# Patient Record
Sex: Female | Born: 1940 | Race: White | Hispanic: No | Marital: Married | Smoking: Never smoker
Health system: Southern US, Community
[De-identification: ages and names within clinical notes are randomized; demographics above are authoritative.]

## PROBLEM LIST (undated history)

## (undated) DIAGNOSIS — E785 Hyperlipidemia, unspecified: Secondary | ICD-10-CM

## (undated) DIAGNOSIS — I447 Left bundle-branch block, unspecified: Secondary | ICD-10-CM

## (undated) DIAGNOSIS — G4730 Sleep apnea, unspecified: Secondary | ICD-10-CM

## (undated) DIAGNOSIS — M1990 Unspecified osteoarthritis, unspecified site: Secondary | ICD-10-CM

## (undated) DIAGNOSIS — D329 Benign neoplasm of meninges, unspecified: Secondary | ICD-10-CM

## (undated) DIAGNOSIS — J40 Bronchitis, not specified as acute or chronic: Secondary | ICD-10-CM

## (undated) DIAGNOSIS — Z973 Presence of spectacles and contact lenses: Secondary | ICD-10-CM

## (undated) DIAGNOSIS — I998 Other disorder of circulatory system: Secondary | ICD-10-CM

## (undated) DIAGNOSIS — R51 Headache: Secondary | ICD-10-CM

## (undated) DIAGNOSIS — J069 Acute upper respiratory infection, unspecified: Secondary | ICD-10-CM

## (undated) DIAGNOSIS — C801 Malignant (primary) neoplasm, unspecified: Secondary | ICD-10-CM

## (undated) DIAGNOSIS — M8580 Other specified disorders of bone density and structure, unspecified site: Secondary | ICD-10-CM

## (undated) DIAGNOSIS — G4700 Insomnia, unspecified: Secondary | ICD-10-CM

## (undated) DIAGNOSIS — R519 Headache, unspecified: Secondary | ICD-10-CM

## (undated) HISTORY — PX: OTHER SURGICAL HISTORY: SHX169

## (undated) HISTORY — DX: Unspecified osteoarthritis, unspecified site: M19.90

## (undated) HISTORY — PX: CATARACT EXTRACTION W/ INTRAOCULAR LENS IMPLANT: SHX1309

## (undated) HISTORY — DX: Benign neoplasm of meninges, unspecified: D32.9

## (undated) HISTORY — PX: SMALL INTESTINE SURGERY: SHX150

## (undated) HISTORY — PX: APPENDECTOMY: SHX54

## (undated) HISTORY — DX: Other specified disorders of bone density and structure, unspecified site: M85.80

## (undated) HISTORY — DX: Insomnia, unspecified: G47.00

## (undated) HISTORY — PX: HERNIA REPAIR: SHX51

## (undated) HISTORY — DX: Acute upper respiratory infection, unspecified: J06.9

## (undated) HISTORY — DX: Hyperlipidemia, unspecified: E78.5

## (undated) HISTORY — PX: KNEE ARTHROSCOPY W/ MEDIAL COLLATERAL LIGAMENT (MCL) REPAIR: SHX1876

## (undated) HISTORY — PX: DILATION AND CURETTAGE OF UTERUS: SHX78

## (undated) HISTORY — PX: WISDOM TOOTH EXTRACTION: SHX21

---

## 1999-07-05 ENCOUNTER — Other Ambulatory Visit: Admission: RE | Admit: 1999-07-05 | Discharge: 1999-07-05 | Payer: Self-pay | Admitting: Internal Medicine

## 2002-11-28 ENCOUNTER — Inpatient Hospital Stay (HOSPITAL_COMMUNITY): Admission: EM | Admit: 2002-11-28 | Discharge: 2002-12-08 | Payer: Self-pay | Admitting: Emergency Medicine

## 2002-11-28 ENCOUNTER — Encounter: Payer: Self-pay | Admitting: General Surgery

## 2002-11-28 ENCOUNTER — Encounter (INDEPENDENT_AMBULATORY_CARE_PROVIDER_SITE_OTHER): Payer: Self-pay | Admitting: Specialist

## 2002-12-06 ENCOUNTER — Encounter: Payer: Self-pay | Admitting: General Surgery

## 2004-03-01 ENCOUNTER — Observation Stay (HOSPITAL_COMMUNITY): Admission: RE | Admit: 2004-03-01 | Discharge: 2004-03-02 | Payer: Self-pay | Admitting: General Surgery

## 2004-10-22 ENCOUNTER — Ambulatory Visit: Payer: Self-pay | Admitting: Sports Medicine

## 2005-09-16 ENCOUNTER — Ambulatory Visit: Payer: Self-pay | Admitting: Sports Medicine

## 2007-10-25 ENCOUNTER — Encounter: Payer: Self-pay | Admitting: Internal Medicine

## 2007-11-25 ENCOUNTER — Encounter: Payer: Self-pay | Admitting: Sports Medicine

## 2009-07-10 ENCOUNTER — Encounter (INDEPENDENT_AMBULATORY_CARE_PROVIDER_SITE_OTHER): Payer: Self-pay | Admitting: *Deleted

## 2009-08-07 ENCOUNTER — Ambulatory Visit: Payer: Self-pay | Admitting: Sports Medicine

## 2009-08-07 DIAGNOSIS — M25469 Effusion, unspecified knee: Secondary | ICD-10-CM

## 2009-08-07 DIAGNOSIS — M25569 Pain in unspecified knee: Secondary | ICD-10-CM | POA: Insufficient documentation

## 2009-08-21 ENCOUNTER — Encounter (INDEPENDENT_AMBULATORY_CARE_PROVIDER_SITE_OTHER): Payer: Self-pay | Admitting: *Deleted

## 2009-08-23 ENCOUNTER — Ambulatory Visit: Payer: Self-pay | Admitting: Internal Medicine

## 2009-08-27 ENCOUNTER — Telehealth (INDEPENDENT_AMBULATORY_CARE_PROVIDER_SITE_OTHER): Payer: Self-pay | Admitting: *Deleted

## 2009-08-30 ENCOUNTER — Ambulatory Visit: Payer: Self-pay | Admitting: Internal Medicine

## 2010-09-19 NOTE — Procedures (Signed)
Summary: Colonoscopy  Patient: [name] Note: All result statuses are Final unless otherwise noted.  Tests: (1) Colonoscopy (COL)   COL Colonoscopy           DONE     [facility]     [address]     [place], [place]  [address]           COLONOSCOPY PROCEDURE REPORT           PATIENT:  Susan Bruce, Susan Bruce  MR#:  [id number]     BIRTHDATE:  08-05-1941, 68 yrs. old  GENDER:  female           ENDOSCOPIST:  Wilhemina Bonito. Eda Keys, MD     Referred by:  Geoffry Paradise, M.D.           PROCEDURE DATE:  08/30/2009     PROCEDURE:  Average-risk screening colonoscopy     G0121     ASA CLASS:  Class I     INDICATIONS:  Routine Risk Screening           MEDICATIONS:   Fentanyl 75 mcg IV, Versed 8 mg IV           DESCRIPTION OF PROCEDURE:   After the risks benefits and     alternatives of the procedure were thoroughly explained, informed     consent was obtained.  Digital rectal exam was performed and     revealed no abnormalities.   The LB CF-H180AL [id number] endoscope     was introduced through the anus and advanced to the anastamosis,     without limitations. Time to anastomosis = 3:79min. The quality of     the prep was good, using MoviPrep.  The instrument was then     withdrawn ( time = 16:17 min) as the colon was fully examined.     <<PROCEDUREIMAGES>>           FINDINGS:  There was a normal ileo-colonic anastamosis.  A normal     appearing cecum, ileocecal valve, and appendiceal orifice were     identified. The ascending, hepatic flexure, transverse, splenic     flexure, descending, sigmoid colon, and rectum appeared     unremarkable.  No polyps or cancers were seen.   Retroflexed views     in the rectum revealed internal hemorrhoids.    The scope was then     withdrawn from the patient and the procedure completed.           COMPLICATIONS:  None           ENDOSCOPIC IMPRESSION:     1) Normal anastamosis, ileo-col     2) Normal colon     3) No polyps or cancers     4) Internal  hemorrhoids     RECOMMENDATIONS:     1) Continue current colorectal screening recommendations for     "routine risk" patients with a repeat colonoscopy in 10 years.           ______________________________     Wilhemina Bonito. Eda Keys, MD           CC:  Geoffry Paradise, MD; The Patient           n.     eSIGNED:   Wilhemina Bonito. Eda Keys at 08/30/2009 09:02 AM           Berdine Addison, [id number]  Note: An exclamation mark (!) indicates a result that was not dispersed into the flowsheet.  Document Creation Date: 08/30/2009 9:03 AM _______________________________________________________________________  (1) Order result status: Final Collection or observation date-time: 08/30/2009 08:49 Requested date-time:  Receipt date-time:  Reported date-time:  Referring Physician:   Ordering Physician: Fransico Setters [phone]) Specimen Source:  Source: Launa Grill Order Number: [id number] Lab site:   Appended Document: Colonoscopy    Clinical Lists Changes  Observations: Added new observation of COLONNXTDUE: 08/2019 (08/30/2009 11:18)

## 2010-09-19 NOTE — Letter (Signed)
Summary: [facility] Instructions  [facility]  [address] [place], [place] [address]   Phone: [phone]  Fax: [phone]       Susan Bruce    April 10, 1941    MRN: [id number]        Procedure Day Dorna Bloom: Magdalene Molly. 01/13     Arrival Time:  7:30am     Procedure Time:  8:00am     Location of Procedure:                    _X _  [facility] (4th Floor)                        PREPARATION FOR COLONOSCOPY WITH MOVIPREP   Starting 5 days prior to your procedure 01/08  do not eat nuts, seeds, popcorn, corn, beans, peas,  salads, or any raw vegetables.  Do not take any fiber supplements (e.g. Metamucil, Citrucel, and Benefiber).  THE DAY BEFORE YOUR PROCEDURE         DATE:  01/12  DAY: Wed.  1.  Drink clear liquids the entire day-NO SOLID FOOD  2.  Do not drink anything colored red or purple.  Avoid juices with pulp.  No orange juice.  3.  Drink at least 64 oz. (8 glasses) of fluid/clear liquids during the day to prevent dehydration and help the prep work efficiently.  CLEAR LIQUIDS INCLUDE: Water Jello Ice Popsicles Tea (sugar ok, no milk/cream) Powdered fruit flavored drinks Coffee (sugar ok, no milk/cream) Gatorade Juice: apple, white grape, white cranberry  Lemonade Clear bullion, consomm, broth Carbonated beverages (any kind) Strained chicken noodle soup Hard Candy                             4.  In the morning, mix first dose of MoviPrep solution:    Empty 1 Pouch A and 1 Pouch B into the disposable container    Add lukewarm drinking water to the top line of the container. Mix to dissolve    Refrigerate (mixed solution should be used within 24 hrs)  5.  Begin drinking the prep at 5:00 p.m. The MoviPrep container is divided by 4 marks.   Every 15 minutes drink the solution down to the next mark (approximately 8 oz) until the full liter is complete.   6.  Follow completed prep with 16 oz of clear liquid of your choice (Nothing red or  purple).  Continue to drink clear liquids until bedtime.  7.  Before going to bed, mix second dose of MoviPrep solution:    Empty 1 Pouch A and 1 Pouch B into the disposable container    Add lukewarm drinking water to the top line of the container. Mix to dissolve    Refrigerate  THE DAY OF YOUR PROCEDURE      DATE: 01/13  DAY: Magdalene Molly.  Beginning at 3:00 a.m. (5 hours before procedure):         1. Every 15 minutes, drink the solution down to the next mark (approx 8 oz) until the full liter is complete.  2. Follow completed prep with 16 oz. of clear liquid of your choice.    3. You may drink clear liquids until 6:00am (2 HOURS BEFORE PROCEDURE).   MEDICATION INSTRUCTIONS  Unless otherwise instructed, you should take regular prescription medications with a small sip of water   as early as possible the  morning of your procedure.          OTHER INSTRUCTIONS  You will need a responsible adult at least 68 years of age to accompany you and drive you home.   This person must remain in the waiting room during your procedure.  Wear loose fitting clothing that is easily removed.  Leave jewelry and other valuables at home.  However, you may wish to bring a book to read or  an iPod/MP3 player to listen to music as you wait for your procedure to start.  Remove all body piercing jewelry and leave at home.  Total time from sign-in until discharge is approximately 2-3 hours.  You should go home directly after your procedure and rest.  You can resume normal activities the  day after your procedure.  The day of your procedure you should not:   Drive   Make legal decisions   Operate machinery   Drink alcohol   Return to work  You will receive specific instructions about eating, activities and medications before you leave.    The above instructions have been reviewed and explained to me by  Susan Sites RN  August 23, 2009 10:35 AM      I fully understand and can  verbalize these instructions _____________________________ Date _________

## 2010-09-19 NOTE — Miscellaneous (Signed)
Summary: [facility]  Clinical Lists Changes  Medications: Added new medication of MOVIPREP 100 GM  SOLR (PEG-KCL-NACL-NASULF-NA ASC-C) As per prep instructions. - Signed Rx of MOVIPREP 100 GM  SOLR (PEG-KCL-NACL-NASULF-NA ASC-C) As per prep instructions.;  #1 x 0;  Signed;  Entered by: Ezra Sites RN;  Authorized by: Hilarie Fredrickson MD;  Method used: Electronically to [id number] [address] [place], [place]  [address], Ph: [phone] or [phone], Fax: [phone] Allergies: Changed allergy or adverse reaction from PCN to PCN    Prescriptions: MOVIPREP 100 GM  SOLR (PEG-KCL-NACL-NASULF-NA ASC-C) As per prep instructions.  #1 x 0   Entered by:   Ezra Sites RN   Authorized by:   Hilarie Fredrickson MD   Signed by:   Ezra Sites RN on 08/23/2009   Method used:   Electronically to        [facility] (retail)       [address]       [place], [place]  [address]       Ph: [phone] or [phone]       Fax: [phone]   RxID:   [id number]

## 2010-09-19 NOTE — Progress Notes (Signed)
Summary: Questions about Procedure  Phone Note Call from Patient Call back at Home Phone [phone]   Caller: Patient Call For: Dr. Marina Goodell Reason for Call: Talk to Nurse Summary of Call: Pt has some questions about her prep work for her procedure on Thurs. Initial call taken by: Karna Christmas,  August 27, 2009 2:47 PM  Follow-up for Phone Call        pt's questions were answered Follow-up by: Ezra Sites RN,  August 27, 2009 3:19 PM

## 2010-11-04 ENCOUNTER — Ambulatory Visit (HOSPITAL_BASED_OUTPATIENT_CLINIC_OR_DEPARTMENT_OTHER)
Admission: RE | Admit: 2010-11-04 | Discharge: 2010-11-04 | Disposition: A | Discharge status: Home / Self Care | Payer: Medicare Other | Source: Ambulatory Visit | Attending: Orthopedic Surgery | Admitting: Orthopedic Surgery

## 2010-11-04 DIAGNOSIS — M23329 Other meniscus derangements, posterior horn of medial meniscus, unspecified knee: Secondary | ICD-10-CM | POA: Insufficient documentation

## 2010-11-04 DIAGNOSIS — Z01812 Encounter for preprocedural laboratory examination: Secondary | ICD-10-CM | POA: Insufficient documentation

## 2010-11-04 DIAGNOSIS — Z01810 Encounter for preprocedural cardiovascular examination: Secondary | ICD-10-CM | POA: Insufficient documentation

## 2010-11-04 DIAGNOSIS — M23302 Other meniscus derangements, unspecified lateral meniscus, unspecified knee: Secondary | ICD-10-CM | POA: Insufficient documentation

## 2010-11-04 DIAGNOSIS — M1710 Unilateral primary osteoarthritis, unspecified knee: Secondary | ICD-10-CM | POA: Insufficient documentation

## 2010-11-04 DIAGNOSIS — R9431 Abnormal electrocardiogram [ECG] [EKG]: Secondary | ICD-10-CM | POA: Insufficient documentation

## 2010-11-13 ENCOUNTER — Other Ambulatory Visit: Payer: Self-pay | Admitting: Dermatology

## 2010-11-19 NOTE — Op Note (Signed)
NAME:  Susan Bruce, Susan Bruce                  ACCOUNT NO.:  [id number]  MEDICAL RECORD NO.:  [id number]            PATIENT TYPE:  LOCATION:                                 FACILITY:  PHYSICIAN:  Marlowe Kays, M.D.  DATE OF BIRTH:  1941-01-14  DATE OF PROCEDURE:  11/04/2010 DATE OF DISCHARGE:                              OPERATIVE REPORT   PREOPERATIVE DIAGNOSES: 1. Torn medial and lateral menisci. 2. Osteoarthritis, right knee.  POSTOPERATIVE DIAGNOSES: 1. Torn medial and lateral menisci. 2. Osteoarthritis, right knee.  OPERATION:  Right knee arthroscopy with: 1. Partial and medial lateral meniscectomies. 2. Shaving of medial and lateral femoral condyle. 3. Shaving of patella.  SURGEON:  Marlowe Kays, M.D.  ASSISTANT:  Nurse.  ANESTHESIA:  General.  INDICATION FOR PROCEDURE:  Because of pain and swelling in her right knee, she had an MRI actually back on December 11, 2009, demonstrating the preoperative diagnoses.  For right reasons, she has waited until now to have the procedure performed.  She was advised preoperatively that the arthroscopic procedure was not designed to particularly help arthritic changes.  DESCRIPTION OF PROCEDURE:  Satisfied general anesthesia, Ace wrap and knee support to left lower extremity, pneumatic tourniquet applied to right lower extremity with leg Esmarched out nonsterilely and tourniquet inflated 350 mmHg.  Thigh stabilizers were then applied.  The right leg prepped with DuraPrep from stabilizer to ankle, draped in a sterile field.  Superomedial saline inflow.  First, through an anterolateral portal, medial compartment knee joint was evaluated.  She had a good bit of synovitis which I resected with a 3.5 shaver.  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  I shaved all this back with the  3.5 shaver.  Posteriorly, she had degenerative-type tear of the posterior curve into the posterior horn and I shaved this down until smooth with 3.5 shaver.  The residual meniscus appeared to be stable on probing. Looking at the medial gutter and suprapatellar area, she had basically complete wear of the patella button, some areas partial wear which I shaved down with a 3.5 shaver.  On reversing portals, the ACL was intact.  She had synovitis in the anterolateral joint which I resected with a 3.5 shaver.  She had some grade 2/4 to 3/4 chondromalacia of both the lateral femoral condyle and lateral tibial plateau and I gently smoothed down the lateral femoral condyle.  The whole entire inner border of the lateral meniscus was torn.  I shaved this down until smooth with 3.5 shaver.  Final pictures taken.  Knee joint was then irrigated to clear and all fluid possibly removed.  I closed the two entry portals with 4-0 nylon and then injected 20 mL 0.5% Marcaine with adrenaline and 4 mg of morphine through the inflow apparatus which I removed and closed this portal with 4-0 nylon as well.  Betadine, Adaptic dry sterile dressings were applied.  Tourniquet was released. She tolerated the procedure well.  At time of dictation, she  was on the way to recovery room in satisfactory condition with no known complications.          ______________________________ Marlowe Kays, M.D.     JA/MEDQ  D:  11/04/2010  T:  11/04/2010  Job:  [id number]  Electronically Signed by Marlowe Kays M.D. on 11/19/2010 03:40:26 PM

## 2011-01-03 NOTE — Op Note (Signed)
NAME:  Susan Bruce, Susan Bruce                             ACCOUNT NO.:  [id number]   MEDICAL RECORD NO.:  [id number]                   PATIENT TYPE:  OBV   LOCATION:  0447                                 FACILITY:  [facility]   PHYSICIAN:  Adolph Pollack, M.D.            DATE OF BIRTH:  10-22-1940   DATE OF PROCEDURE:  03/01/2004  DATE OF DISCHARGE:                                 OPERATIVE REPORT   PREOPERATIVE DIAGNOSIS:  Ventral incisional hernia.   POSTOPERATIVE DIAGNOSIS:  Ventral incisional hernia.   PROCEDURE:  Laparoscopic ventral incisional hernia repair with mesh.   SURGEON:  Adolph Pollack, M.D.   ASSISTANT:  Leonie Man, M.D.   ANESTHESIA:  General.   INDICATIONS:  [name] is a 70 year old female who had a ruptured appendix  requiring an exploratory laparotomy and ileocecectomy.  She subsequently  developed an incisional hernia from that in the lower midline.  She now  presents for repair.  The procedure and the risks were discussed with her  preoperatively.   TECHNIQUE:  She was seen in the holding area, then brought to the operating  room, placed supine on the operating table, and a general anesthetic was  administered.  A Foley catheter was placed in the bladder.  Her abdominal  wall was sterilely prepped and draped.  A small incision was made in the  supraumbilical region through the skin and subcutaneous tissue and midline  fascia, and the peritoneal cavity was entered bluntly and under direct  vision.  A pursestring suture of 0 Vicryl was placed around the fascial  edges.  A Hasson trocar was introduced into the peritoneal cavity and  pneumoperitoneum created by insufflation of CO2 gas.   Next the laparoscope was introduced and omental adhesions were noted to the  lower abdominal wall.  A 5 mm trocar was then placed into the right  midabdomen and one into the left midabdomen.  An adhesiolysis was performed,  lysing adhesions between the anterior abdominal wall  and the omentum using  the Harmonic scalpel and sharp dissection.  No intestine was near this area,  and no intestinal injuries were made.  The incisional hernia defect was  identified and omentum was reduced from it.  The rims around the hernia were  then cleared off of omental adhesions.   A spinal needle was used to mark the edge of the hernia defect in four  quadrants, and then 4 cm was measured away from this.  A large oval was  drawn on the abdominal wall and then a piece of polypropylene mesh with an  anti-adhesive barrier was brought into the field and cut to appropriate  size.  Eight anchoring sutures were placed in the mesh of 0 Novofil.  The  mesh was then hydrated, rolled up, and placed into the abdominal cavity.  It  was positioned appropriately with the left side pointing up and the  nonadhesive barrier side pointing toward the viscera.  Eight small stab  incisions were made on the periphery of the lower abdomen and all the  anchoring sutures were brought up through the anterior abdominal wall to  fascial bridge.  These were then tied down, anchoring the mesh to the  anterior abdominal wall.  I then took a spiral tacker and further anchored  the mesh in the outer periphery and inner periphery with the spiral tacks.  A little laxity was present.  There was more than adequate coverage of the  hernia defect with adequate overlap.   I inspected the area and some dried clots were then evacuated by the way of  suction.  No active bleeding was noted.  The bowel was then again inspected,  and no injury was noted.  In order to be able to tack the mesh up, I did  have to have two more 5 mm trocars, one in the right lower quadrant and one  in the left lower quadrant.  I then removed all the trocars and released the  pneumoperitoneum.  The supraumbilical fascial defect was closed by  tightening up and tying down the pursestring suture.  All the incisions were  then closed with 4-0  Monocryl subcuticular stitches, followed by Steri-  Strips and sterile dressings.   She tolerated the procedure well without any apparent complications and was  taken to the recovery room in satisfactory condition.                                               Adolph Pollack, M.D.    Kari Baars  D:  03/01/2004  T:  03/01/2004  Job:  [id number]   cc:   Geoffry Paradise, M.D.  [address]  [place]  [place] [address]  Fax: [phone]

## 2011-01-03 NOTE — Op Note (Signed)
Susan Bruce, Susan Bruce                               ACCOUNT NO.:  [id number]   MEDICAL RECORD NO.:  [id number]                   PATIENT TYPE:  INP   LOCATION:  0105                                 FACILITY:  [facility]   PHYSICIAN:  Adolph Pollack, M.D.            DATE OF BIRTH:  04/17/1941   DATE OF PROCEDURE:  11/28/2002  DATE OF DISCHARGE:                                 OPERATIVE REPORT   PREOPERATIVE DIAGNOSIS:  Acute appendicitis.   POSTOPERATIVE DIAGNOSIS:  Perforated appendicitis.   PROCEDURE:  Laparoscopic-assisted partial colectomy.   SURGEON:  Adolph Pollack, M.D.   ANESTHESIA:  General.   INDICATIONS:  Susan Bruce is a 70 year old female with abdominal pain and then  fever for the past 3-4 days.  The pain has progressively worsened, and she  continued to spike fevers.  A CT scan demonstrated findings consisted with  appendicitis and a significant right lower quadrant inflammatory process.  She subsequently was brought to the operating room.  The procedure and risks  were explained to her preoperatively.   TECHNIQUE:  She was brought to the operating room and was placed supine on  the operating table, and general anesthetic was administered.  A Foley  catheter was placed in the bladder. Her abdomen was sterilely prepped and  draped.  A small subumbilical incision was made, incising the skin and  subcutaneous tissue in the midline fascia sharply.  The peritoneum was  incised sharply, and the peritoneal cavity was entered under direct vision.  A pursestring suture of 0 Vicryl was placed around the fascial edges.  A  Hasson trocar was introduced into the peritoneal cavity, and a  pneumoperitoneum was created by insufflation of CO2 gas.  Next, the  laparoscope was introduced.  A large inflammatory mass was noted in the  right lower quadrant.  Two 5 mm trocars were then placed in the lower  midline.  The distal ileum was fairly adherent to the cecal area.  I was  able to  identify the appendix and using careful blunt dissection take away  adhesions of the omentum and distal ileum to the base of the appendix, which  was ruptured with fecal spillage.  This had been essentially walled off.   At this point, it was felt that the operation could not be done  laparoscopically completely.  I then mobilized the right colon by incising  its peritoneal attachments close to the colon and medializing it all the way  up to the hepatic flexure.  I then removed the two 5 mm trocars and made an  incision connecting the two of them to the skin, subcutaneous tissue, and  fascia.  I brought out the distal ileum in the proximal ascending colon and  incised some more peritoneal attachments.  There was a large inflammatory  mass.  I did remove the appendix at that time, as it just basically  fell  off.   I then divided the mesentery of the large intestine and then divided the  ascending colon at the proximal third/distal two-third junction.  The ileum  was also divided, and the mesoappendix of the ileum was divided with vessels  ligated.  The specimen was handed off the field.  I copiously irrigated out  the abdominal cavity with 3 liters of normal saline and taking out small  pieces of stool.  Irrigation then became clear.   The ileum was placed side-to-side with the ascending colon, and staple  anastomosis was performed.  There was some bleeding from the posterior  staple line.  This was oversewn with interrupted 3-0 Vicryl figure-of-eight  sutures until loosely hemostatic.  I then closed the remaining enterotomy  with a TA-60 linear knot cutting stapler.  I reinforced this staple line  with interrupted 3-0 Vicryl sutures.  I then placed a 3-0 Vicryl suture to  reinforce the distal anastomotic staple line so it was under no tension.   Following this, gloves were changed.  The anastomosis was inspected.  It was  patent and viable and under no tension.   I then copiously  irrigated out the abdominal cavity with approximately 3  more liters of warm saline solution.  No more infected-looking material was  evacuated, and the irrigation was clear.   At this point, needle, sponge, and instrument count was performed, and they  were reportedly correct.  I then closed the subumbilical fascia defect by  tightening up and tieing down the pursestring suture.  The lower midline  fascial defect was closed with a running #1 PDS suture.  The subcutaneous  tissue at both incisions was irrigated.  I then closed the umbilical  incision with staples and only partially closed the lower abdominal incision  with staples and then used some moist gauze to pack in between the staples.  A bulky dressing was applied.   She tolerated the procedure fairly well without any apparent complications.  The estimated blood loss was approximately 500 mL.  IV fluids given were  4000 mL, and urine output was 450 mL.  She subsequently was extubated and  taken to the recovery room in satisfactory condition.                                               Adolph Pollack, M.D.    Kari Baars  D:  11/28/2002  T:  11/29/2002  Job:  [id number]   cc:   Gaspar Garbe, M.D.  [address]  [place]  [place] [address]  Fax: [phone]

## 2011-01-03 NOTE — Discharge Summary (Signed)
Susan Bruce, Susan Bruce                               ACCOUNT NO.:  [id number]   MEDICAL RECORD NO.:  [id number]                   PATIENT TYPE:  INP   LOCATION:  0361                                 FACILITY:  [facility]   PHYSICIAN:  Adolph Pollack, M.D.            DATE OF BIRTH:  1940/09/22   DATE OF ADMISSION:  11/28/2002  DATE OF DISCHARGE:  12/08/2002                                 DISCHARGE SUMMARY   PRINCIPAL DISCHARGE DIAGNOSIS:  Perforated appendicitis.   SECONDARY DIAGNOSES:  1. Hyponatremia.  2. Ileus.   PROCEDURE:  Laparoscopic assisted partial colectomy.   INDICATION FOR ADMISSION:  A 70 year old female with the onset of lower  abdominal pain four nights prior to her visit to the emergency department.  She started having worsening lower abdominal pain, fever and chills and poor  appetite.  She presented to the emergency department and a CT scan was done  which demonstrated dilated appendix with periappendiceal and pericecal  inflammatory changes.  White cell count was normal.  She subsequently  admitted to the hospital and taken to the operating room.   HOSPITAL COURSE:  She had severe perforated appendicitis with a stump of  appendix perforated and a large hole in the cecum.  She underwent a  laparoscopic assisted partial colectomy.  She is maintained on IV  antibiotics.  She continued to have fevers and ileus and mild hyponatremia  with resolved with change in antibiotic.  She is maintained on IV Zosyn.  Her ileus slowly started to resolve.  She had an NG tube in and we started  clamping this and she tolerated that well and began passing gas.  The NG  tube was removed and she started on liquid diet.  Fever persisted and so a  CT scan was checked on postoperative day #8 and two very small right lower  fluid collections  were seen.  Questionable abscess versus postoperative  change.  However, her clinical course was one of continued improvement and  by her 10th  postoperative day she was tolerating her diet, moving her  bowels.  I had gone ahead and opened up her wound and packed it and she is  felt to be ready for discharge.   DISPOSITION:  Discharged to home on 12/08/02.  Home health nurses will help  her dress her wound.  She will be discharged home on Cipro and Flagyl as  well as Vicodin for pain.  She is given activity restrictions and will see  me back in one week for followup.  Her condition was satisfactory.                                                Adolph Pollack, M.D.    TJR/MEDQ  D:  12/19/2002  T:  12/19/2002  Job:  [id number]

## 2011-01-03 NOTE — H&P (Signed)
Susan Bruce, Susan Bruce                               ACCOUNT NO.:  [id number]   MEDICAL RECORD NO.:  [id number]                   PATIENT TYPE:  EMS   LOCATION:  ED                                   FACILITY:  [facility]   PHYSICIAN:  Adolph Pollack, M.D.            DATE OF BIRTH:  04-14-1941   DATE OF ADMISSION:  11/28/2002  DATE OF DISCHARGE:                                HISTORY & PHYSICAL   CHIEF COMPLAINT:  Lower abdominal pain.   HISTORY OF PRESENT ILLNESS:  [name] is a 70 year old female, who had an  increase in fatigue on Thursday, then Thursday night began feeling some  lower abdominal pain, sharp and crampy.  Friday, she began spiking fevers as  high as 102, and they were fairly persistent, and she had chills.  Over the  weekend, she continued to spike fevers and has had persistent pain and poor  appetite.  No nausea or vomiting, no dysuria, no vaginal discharge, and no  change in her bowel habits.  She saw Gaspar Garbe, M.D., who examined  her and sent her for a CT scan which was read as suspicious for  appendicitis.  I was subsequently asked to see her, and she presents now to  [facility] for evaluation.   PAST MEDICAL HISTORY:  Bronchitis.   PREVIOUS OPERATIONS:  D&C.   ALLERGIES:  None.   MEDICATIONS:  Vitamins.   SOCIAL HISTORY:  She is married.  No tobacco use.  She has a nightly glass  of wine.   FAMILY HISTORY:  No chronic illnesses in the family.  Her father is 20, and  her mother is in her late 52's.   REVIEW OF SYSTEMS:  CARDIOVASCULAR:  No hypertension or heart disease.  PULMONARY:  No asthma, pneumonia, tuberculosis.  GI:  No peptic ulcer  disease, diverticulitis, hepatitis.  GU:  No kidney stones or urinary tract  infections.  No hematuria.  ENDOCRINE:  No diabetes or thyroid disease.  NEUROLOGIC:  No strokes or seizures.  HEMATOLOGIC:  No bleeding disorders,  transfusions, or deep venous thrombosis.  SKIN:  She states she  had some  drainage from the umbilicus back in October 2003.  A CT scan at that time  did not demonstrate any acute inflammatory process.   PHYSICAL EXAMINATION:  GENERAL:  Well-developed, well-nourished female who  appears to be in no acute distress, although she states she is hurting in  the right lower quadrant region.  VITAL SIGNS:  Temperature 100.4, blood pressure 141/82, pulse 106.  SKIN:  Warm and dry, no jaundice.  EYES:  Extraocular motions intact.  No icterus.  NECK:  Supple without thyromegaly or large masses.  CARDIOVASCULAR:  Heart demonstrates increased rate with what appears to be a  regular rhythm.  LUNGS:  Equal breath sounds.  Clear to auscultation.  ABDOMEN:  Soft.  There is  right lower quadrant tenderness to percussion and  palpation but no mass noted.  There is a positive Rovsing sign.  Hypoactive  bowel sounds are noted.  BACK:  No CVA tenderness.  EXTREMITIES:  Full range of motion.  No cyanosis or edema.   LABORATORY DATA:  White cell count is 9700, hemoglobin 15.  CMET normal.   Abdominal CT to my review demonstrates a dilated appendix with some  periappendiceal and pericecal inflammatory changes.  No abscess noted.   IMPRESSION:  Lower abdominal pain with pericecal inflammatory mass.  Based  on the clinical history as well as CT scan, it looks like she has acute  appendicitis with a possible contained rupture.  Other possible cause would  be cecal diverticulitis contained rupture.   PLAN:  1. Admit to the hospital.  2. IV antibiotics.  3. Laparoscopic, possible open appendectomy.  We did discuss the procedure     rationale and the risks.  The risks include but are not limited to     bleeding, infection, risk of general anesthesia, possible damage to     abdominal organs (such as intestine, bladder, etc.).  She seems to     understand this and agrees to proceed.                                               Adolph Pollack, M.D.    Kari Baars  D:   11/28/2002  T:  11/28/2002  Job:  [id number]

## 2011-09-11 DIAGNOSIS — H524 Presbyopia: Secondary | ICD-10-CM | POA: Diagnosis not present

## 2011-09-11 DIAGNOSIS — H2510 Age-related nuclear cataract, unspecified eye: Secondary | ICD-10-CM | POA: Diagnosis not present

## 2011-10-01 ENCOUNTER — Other Ambulatory Visit: Payer: Self-pay | Admitting: Dermatology

## 2011-10-01 DIAGNOSIS — Z85828 Personal history of other malignant neoplasm of skin: Secondary | ICD-10-CM | POA: Diagnosis not present

## 2011-10-01 DIAGNOSIS — L570 Actinic keratosis: Secondary | ICD-10-CM | POA: Diagnosis not present

## 2011-10-01 DIAGNOSIS — D485 Neoplasm of uncertain behavior of skin: Secondary | ICD-10-CM | POA: Diagnosis not present

## 2011-10-01 DIAGNOSIS — D235 Other benign neoplasm of skin of trunk: Secondary | ICD-10-CM | POA: Diagnosis not present

## 2011-10-01 DIAGNOSIS — L821 Other seborrheic keratosis: Secondary | ICD-10-CM | POA: Diagnosis not present

## 2011-10-14 DIAGNOSIS — M899 Disorder of bone, unspecified: Secondary | ICD-10-CM | POA: Diagnosis not present

## 2011-10-14 DIAGNOSIS — E785 Hyperlipidemia, unspecified: Secondary | ICD-10-CM | POA: Diagnosis not present

## 2011-10-14 DIAGNOSIS — R82998 Other abnormal findings in urine: Secondary | ICD-10-CM | POA: Diagnosis not present

## 2011-10-14 DIAGNOSIS — M949 Disorder of cartilage, unspecified: Secondary | ICD-10-CM | POA: Diagnosis not present

## 2011-10-21 DIAGNOSIS — Z0000 Encounter for general adult medical examination without abnormal findings: Secondary | ICD-10-CM | POA: Diagnosis not present

## 2011-10-21 DIAGNOSIS — M1990 Unspecified osteoarthritis, unspecified site: Secondary | ICD-10-CM | POA: Diagnosis not present

## 2011-10-21 DIAGNOSIS — E785 Hyperlipidemia, unspecified: Secondary | ICD-10-CM | POA: Diagnosis not present

## 2012-05-04 DIAGNOSIS — Z23 Encounter for immunization: Secondary | ICD-10-CM | POA: Diagnosis not present

## 2012-09-20 DIAGNOSIS — H259 Unspecified age-related cataract: Secondary | ICD-10-CM | POA: Diagnosis not present

## 2012-09-20 DIAGNOSIS — H43819 Vitreous degeneration, unspecified eye: Secondary | ICD-10-CM | POA: Diagnosis not present

## 2012-09-20 DIAGNOSIS — H5200 Hypermetropia, unspecified eye: Secondary | ICD-10-CM | POA: Diagnosis not present

## 2012-10-19 DIAGNOSIS — E785 Hyperlipidemia, unspecified: Secondary | ICD-10-CM | POA: Diagnosis not present

## 2012-10-19 DIAGNOSIS — M899 Disorder of bone, unspecified: Secondary | ICD-10-CM | POA: Diagnosis not present

## 2012-10-19 DIAGNOSIS — Z0000 Encounter for general adult medical examination without abnormal findings: Secondary | ICD-10-CM | POA: Diagnosis not present

## 2012-10-27 DIAGNOSIS — E669 Obesity, unspecified: Secondary | ICD-10-CM | POA: Diagnosis not present

## 2012-10-27 DIAGNOSIS — Z0000 Encounter for general adult medical examination without abnormal findings: Secondary | ICD-10-CM | POA: Diagnosis not present

## 2012-10-27 DIAGNOSIS — Z124 Encounter for screening for malignant neoplasm of cervix: Secondary | ICD-10-CM | POA: Diagnosis not present

## 2012-10-27 DIAGNOSIS — Z23 Encounter for immunization: Secondary | ICD-10-CM | POA: Diagnosis not present

## 2012-10-27 DIAGNOSIS — E785 Hyperlipidemia, unspecified: Secondary | ICD-10-CM | POA: Diagnosis not present

## 2012-10-27 DIAGNOSIS — Z1331 Encounter for screening for depression: Secondary | ICD-10-CM | POA: Diagnosis not present

## 2012-10-27 DIAGNOSIS — M1990 Unspecified osteoarthritis, unspecified site: Secondary | ICD-10-CM | POA: Diagnosis not present

## 2012-10-29 DIAGNOSIS — Z1212 Encounter for screening for malignant neoplasm of rectum: Secondary | ICD-10-CM | POA: Diagnosis not present

## 2013-01-28 ENCOUNTER — Ambulatory Visit: Payer: Medicare Other

## 2013-01-28 ENCOUNTER — Other Ambulatory Visit: Payer: Self-pay | Admitting: Family Medicine

## 2013-01-28 ENCOUNTER — Ambulatory Visit (INDEPENDENT_AMBULATORY_CARE_PROVIDER_SITE_OTHER): Payer: Medicare Other | Admitting: Family Medicine

## 2013-01-28 VITALS — BP 132/80 | HR 76 | Temp 99.0°F | Resp 16 | Ht 67.0 in

## 2013-01-28 DIAGNOSIS — S42302A Unspecified fracture of shaft of humerus, left arm, initial encounter for closed fracture: Secondary | ICD-10-CM

## 2013-01-28 DIAGNOSIS — S42309A Unspecified fracture of shaft of humerus, unspecified arm, initial encounter for closed fracture: Secondary | ICD-10-CM | POA: Diagnosis not present

## 2013-01-28 DIAGNOSIS — M25519 Pain in unspecified shoulder: Secondary | ICD-10-CM | POA: Diagnosis not present

## 2013-01-28 DIAGNOSIS — M79602 Pain in left arm: Secondary | ICD-10-CM

## 2013-01-28 DIAGNOSIS — M79609 Pain in unspecified limb: Secondary | ICD-10-CM | POA: Diagnosis not present

## 2013-01-28 DIAGNOSIS — M25512 Pain in left shoulder: Secondary | ICD-10-CM

## 2013-01-28 MED ORDER — HYDROCODONE-ACETAMINOPHEN 5-325 MG PO TABS: 1.0000 | ORAL_TABLET | Freq: Four times a day (QID) | ORAL | Status: DC | PRN

## 2013-01-28 NOTE — Patient Instructions (Addendum)
1. RETURN IMMEDIATELY FOR FINGERS TURNING BLUE.  RETURN FOR NUMBNESS OR TINGLING IN ARM. 2.  WEAR SLING AT ALL TIMES OTHER THAN WITH SHOWERING. 3.  TAKE HYDROCODONE FOR PAIN; DO NOT DRIVE WITH HYDROCODONE. 4.  ICE ARM TWICE DAILY.

## 2013-01-28 NOTE — Progress Notes (Signed)
[address]   [place], [place]  [address]   [phone]  Subjective:    Patient ID: Susan Bruce, female    DOB: 1941-01-08, 72 y.o.   MRN: [id number]  HPI This 72 y.o. female presents for evaluation of L upper arm pain.  Occurred today.  Walking on concrete with gravel; slipped on gravel and landed onto L shoulder/arm.  Unable to move arm due to pain.  No n/t.  No cyanosis of fingers.  Pain mostly in shoulder and upper arm.  No elbow or wrist pain or hand pain.  Occurred three hours ago; no ice; no medication.  Denies dizziness or near syncope.  PCP: Jacky Kindle.   Review of Systems  Constitutional: Negative for fever, chills, diaphoresis and fatigue.  Musculoskeletal: Positive for myalgias and arthralgias.  Skin: Negative for color change, pallor, rash and wound.  Neurological: Positive for weakness. Negative for numbness.    Past Medical History  Diagnosis Date  . Arthritis     Past Surgical History  Procedure Laterality Date  . Appendectomy    . Hernia repair    . Small intestine surgery      Prior to Admission medications   Not on File    Allergies  Allergen Reactions  . Penicillins     REACTION: rash    History   Social History  . Marital Status: Married    Spouse Name: N/A    Number of Children: N/A  . Years of Education: N/A   Occupational History  . Not on file.   Social History Main Topics  . Smoking status: Never Smoker   . Smokeless tobacco: Not on file  . Alcohol Use: 3.0 oz/week    5 Glasses of wine per week  . Drug Use: No  . Sexually Active: Not on file     Comment: married   Other Topics Concern  . Not on file   Social History Narrative  . No narrative on file    Family History  Problem Relation Age of Onset  . Aortic stenosis Mother   . Atrial fibrillation Mother   . Neuropathy Mother   . Melanoma Father   . Cancer Paternal Grandmother        Objective:   Physical Exam  Nursing note and vitals reviewed. Constitutional:  She is oriented to person, place, and time. She appears well-developed and well-nourished. No distress.  Cardiovascular: Normal rate, regular rhythm and normal heart sounds.   Pulses:      Radial pulses are 2+ on the left side.  Capillary refill < 3 seconds L arm.  Pulmonary/Chest: Effort normal and breath sounds normal. She has no wheezes. She has no rales.  Musculoskeletal:       Left shoulder: She exhibits decreased range of motion, tenderness, bony tenderness, pain and decreased strength. She exhibits no swelling, no spasm and normal pulse.       Left elbow: She exhibits decreased range of motion. She exhibits no swelling, no effusion, no deformity and no laceration. No tenderness found. No radial head, no lateral epicondyle and no olecranon process tenderness noted.       Left wrist: She exhibits normal range of motion, no tenderness, no bony tenderness, no swelling and no effusion.       Cervical back: Normal.       Left hand: She exhibits normal range of motion, no tenderness, no bony tenderness, normal capillary refill, no deformity, no laceration and no swelling.  Neurological:  She is alert and oriented to person, place, and time. No sensory deficit.  Skin: She is not diaphoretic.    [facility] reading (PRIMARY) by  Dr. Katrinka Blazing.  L HUMERUS: PROXIMAL HUMERUS FRACTURE.  CXR: NAD; no PTX      Assessment & Plan:  Shoulder pain, left - Plan: DG Humerus Left  Arm pain, anterior, left - Plan: DG Shoulder Left  Humerus fracture, left, closed, initial encounter   1.  L Shoulder Pain:  New. Secondary to humerus fracture.   Rx for hydrocodone provided.   2.  L humerus Fracture Closed initial encounter: New.  Sling placed in office; refer to ortho.    Meds ordered this encounter  Medications  . HYDROcodone-acetaminophen (NORCO/VICODIN) 5-325 MG per tablet    Sig: Take 1 tablet by mouth every 6 (six) hours as needed for pain.    Dispense:  40 tablet    Refill:  0

## 2013-02-01 ENCOUNTER — Encounter (HOSPITAL_COMMUNITY): Payer: Self-pay | Admitting: *Deleted

## 2013-02-01 DIAGNOSIS — S42293A Other displaced fracture of upper end of unspecified humerus, initial encounter for closed fracture: Secondary | ICD-10-CM | POA: Diagnosis not present

## 2013-02-02 ENCOUNTER — Ambulatory Visit (HOSPITAL_COMMUNITY): Admission: RE | Admit: 2013-02-02 | Payer: Medicare Other | Source: Ambulatory Visit | Admitting: Orthopedic Surgery

## 2013-02-02 HISTORY — DX: Malignant (primary) neoplasm, unspecified: C80.1

## 2013-02-02 SURGERY — OPEN REDUCTION INTERNAL FIXATION (ORIF) SHOULDER FRACTURE
Anesthesia: General | Site: Shoulder | Laterality: Left

## 2013-02-08 DIAGNOSIS — S42293A Other displaced fracture of upper end of unspecified humerus, initial encounter for closed fracture: Secondary | ICD-10-CM | POA: Diagnosis not present

## 2013-02-15 DIAGNOSIS — S42293A Other displaced fracture of upper end of unspecified humerus, initial encounter for closed fracture: Secondary | ICD-10-CM | POA: Diagnosis not present

## 2013-03-02 DIAGNOSIS — S42309D Unspecified fracture of shaft of humerus, unspecified arm, subsequent encounter for fracture with routine healing: Secondary | ICD-10-CM | POA: Diagnosis not present

## 2013-03-07 DIAGNOSIS — M25519 Pain in unspecified shoulder: Secondary | ICD-10-CM | POA: Diagnosis not present

## 2013-03-10 DIAGNOSIS — M25519 Pain in unspecified shoulder: Secondary | ICD-10-CM | POA: Diagnosis not present

## 2013-03-14 DIAGNOSIS — M25519 Pain in unspecified shoulder: Secondary | ICD-10-CM | POA: Diagnosis not present

## 2013-03-17 DIAGNOSIS — M25519 Pain in unspecified shoulder: Secondary | ICD-10-CM | POA: Diagnosis not present

## 2013-03-21 DIAGNOSIS — M25519 Pain in unspecified shoulder: Secondary | ICD-10-CM | POA: Diagnosis not present

## 2013-03-24 DIAGNOSIS — M25519 Pain in unspecified shoulder: Secondary | ICD-10-CM | POA: Diagnosis not present

## 2013-03-28 DIAGNOSIS — M25519 Pain in unspecified shoulder: Secondary | ICD-10-CM | POA: Diagnosis not present

## 2013-03-31 DIAGNOSIS — M25519 Pain in unspecified shoulder: Secondary | ICD-10-CM | POA: Diagnosis not present

## 2013-04-07 DIAGNOSIS — M25519 Pain in unspecified shoulder: Secondary | ICD-10-CM | POA: Diagnosis not present

## 2013-04-11 DIAGNOSIS — M25519 Pain in unspecified shoulder: Secondary | ICD-10-CM | POA: Diagnosis not present

## 2013-04-13 DIAGNOSIS — S42309D Unspecified fracture of shaft of humerus, unspecified arm, subsequent encounter for fracture with routine healing: Secondary | ICD-10-CM | POA: Diagnosis not present

## 2013-04-15 DIAGNOSIS — M25519 Pain in unspecified shoulder: Secondary | ICD-10-CM | POA: Diagnosis not present

## 2013-04-20 DIAGNOSIS — M25519 Pain in unspecified shoulder: Secondary | ICD-10-CM | POA: Diagnosis not present

## 2013-04-22 DIAGNOSIS — M25519 Pain in unspecified shoulder: Secondary | ICD-10-CM | POA: Diagnosis not present

## 2013-04-25 DIAGNOSIS — M25519 Pain in unspecified shoulder: Secondary | ICD-10-CM | POA: Diagnosis not present

## 2013-04-28 DIAGNOSIS — M25519 Pain in unspecified shoulder: Secondary | ICD-10-CM | POA: Diagnosis not present

## 2013-05-02 DIAGNOSIS — M25519 Pain in unspecified shoulder: Secondary | ICD-10-CM | POA: Diagnosis not present

## 2013-05-05 DIAGNOSIS — M25519 Pain in unspecified shoulder: Secondary | ICD-10-CM | POA: Diagnosis not present

## 2013-05-05 DIAGNOSIS — Z23 Encounter for immunization: Secondary | ICD-10-CM | POA: Diagnosis not present

## 2013-05-09 DIAGNOSIS — M25519 Pain in unspecified shoulder: Secondary | ICD-10-CM | POA: Diagnosis not present

## 2013-05-12 DIAGNOSIS — M25519 Pain in unspecified shoulder: Secondary | ICD-10-CM | POA: Diagnosis not present

## 2013-05-16 DIAGNOSIS — M25519 Pain in unspecified shoulder: Secondary | ICD-10-CM | POA: Diagnosis not present

## 2013-05-30 DIAGNOSIS — M25519 Pain in unspecified shoulder: Secondary | ICD-10-CM | POA: Diagnosis not present

## 2013-06-06 DIAGNOSIS — M25519 Pain in unspecified shoulder: Secondary | ICD-10-CM | POA: Diagnosis not present

## 2013-06-13 DIAGNOSIS — M25519 Pain in unspecified shoulder: Secondary | ICD-10-CM | POA: Diagnosis not present

## 2013-06-14 DIAGNOSIS — M25519 Pain in unspecified shoulder: Secondary | ICD-10-CM | POA: Diagnosis not present

## 2013-07-21 DIAGNOSIS — M25519 Pain in unspecified shoulder: Secondary | ICD-10-CM | POA: Diagnosis not present

## 2013-07-28 DIAGNOSIS — M25519 Pain in unspecified shoulder: Secondary | ICD-10-CM | POA: Diagnosis not present

## 2013-08-17 DIAGNOSIS — L821 Other seborrheic keratosis: Secondary | ICD-10-CM | POA: Diagnosis not present

## 2013-08-17 DIAGNOSIS — L723 Sebaceous cyst: Secondary | ICD-10-CM | POA: Diagnosis not present

## 2013-08-17 DIAGNOSIS — D235 Other benign neoplasm of skin of trunk: Secondary | ICD-10-CM | POA: Diagnosis not present

## 2013-08-17 DIAGNOSIS — D2360 Other benign neoplasm of skin of unspecified upper limb, including shoulder: Secondary | ICD-10-CM | POA: Diagnosis not present

## 2013-08-17 DIAGNOSIS — Z85828 Personal history of other malignant neoplasm of skin: Secondary | ICD-10-CM | POA: Diagnosis not present

## 2013-08-17 DIAGNOSIS — L820 Inflamed seborrheic keratosis: Secondary | ICD-10-CM | POA: Diagnosis not present

## 2013-09-22 DIAGNOSIS — H43819 Vitreous degeneration, unspecified eye: Secondary | ICD-10-CM | POA: Diagnosis not present

## 2013-09-22 DIAGNOSIS — H259 Unspecified age-related cataract: Secondary | ICD-10-CM | POA: Diagnosis not present

## 2013-09-22 DIAGNOSIS — H5200 Hypermetropia, unspecified eye: Secondary | ICD-10-CM | POA: Diagnosis not present

## 2013-09-22 DIAGNOSIS — H524 Presbyopia: Secondary | ICD-10-CM | POA: Diagnosis not present

## 2013-10-26 DIAGNOSIS — R82998 Other abnormal findings in urine: Secondary | ICD-10-CM | POA: Diagnosis not present

## 2013-10-26 DIAGNOSIS — E785 Hyperlipidemia, unspecified: Secondary | ICD-10-CM | POA: Diagnosis not present

## 2013-10-26 DIAGNOSIS — M899 Disorder of bone, unspecified: Secondary | ICD-10-CM | POA: Diagnosis not present

## 2013-10-26 DIAGNOSIS — M949 Disorder of cartilage, unspecified: Secondary | ICD-10-CM | POA: Diagnosis not present

## 2013-11-02 DIAGNOSIS — Z6830 Body mass index (BMI) 30.0-30.9, adult: Secondary | ICD-10-CM | POA: Diagnosis not present

## 2013-11-02 DIAGNOSIS — Z1331 Encounter for screening for depression: Secondary | ICD-10-CM | POA: Diagnosis not present

## 2013-11-02 DIAGNOSIS — Z0000 Encounter for general adult medical examination without abnormal findings: Secondary | ICD-10-CM | POA: Diagnosis not present

## 2013-11-02 DIAGNOSIS — E669 Obesity, unspecified: Secondary | ICD-10-CM | POA: Diagnosis not present

## 2013-11-02 DIAGNOSIS — M1990 Unspecified osteoarthritis, unspecified site: Secondary | ICD-10-CM | POA: Diagnosis not present

## 2013-11-02 DIAGNOSIS — E785 Hyperlipidemia, unspecified: Secondary | ICD-10-CM | POA: Diagnosis not present

## 2013-11-03 DIAGNOSIS — Z1212 Encounter for screening for malignant neoplasm of rectum: Secondary | ICD-10-CM | POA: Diagnosis not present

## 2014-03-15 DIAGNOSIS — J310 Chronic rhinitis: Secondary | ICD-10-CM | POA: Diagnosis not present

## 2014-03-15 DIAGNOSIS — H9319 Tinnitus, unspecified ear: Secondary | ICD-10-CM | POA: Diagnosis not present

## 2014-03-15 DIAGNOSIS — R51 Headache: Secondary | ICD-10-CM | POA: Diagnosis not present

## 2014-04-26 DIAGNOSIS — J310 Chronic rhinitis: Secondary | ICD-10-CM | POA: Diagnosis not present

## 2014-04-26 DIAGNOSIS — H905 Unspecified sensorineural hearing loss: Secondary | ICD-10-CM | POA: Diagnosis not present

## 2014-04-26 DIAGNOSIS — H9319 Tinnitus, unspecified ear: Secondary | ICD-10-CM | POA: Diagnosis not present

## 2014-05-04 DIAGNOSIS — Z23 Encounter for immunization: Secondary | ICD-10-CM | POA: Diagnosis not present

## 2014-08-16 ENCOUNTER — Other Ambulatory Visit: Payer: Self-pay | Admitting: Dermatology

## 2014-08-16 DIAGNOSIS — L720 Epidermal cyst: Secondary | ICD-10-CM | POA: Diagnosis not present

## 2014-08-16 DIAGNOSIS — D2271 Melanocytic nevi of right lower limb, including hip: Secondary | ICD-10-CM | POA: Diagnosis not present

## 2014-08-16 DIAGNOSIS — L821 Other seborrheic keratosis: Secondary | ICD-10-CM | POA: Diagnosis not present

## 2014-08-16 DIAGNOSIS — D225 Melanocytic nevi of trunk: Secondary | ICD-10-CM | POA: Diagnosis not present

## 2014-08-16 DIAGNOSIS — D2362 Other benign neoplasm of skin of left upper limb, including shoulder: Secondary | ICD-10-CM | POA: Diagnosis not present

## 2014-08-16 DIAGNOSIS — D2272 Melanocytic nevi of left lower limb, including hip: Secondary | ICD-10-CM | POA: Diagnosis not present

## 2014-08-16 DIAGNOSIS — D485 Neoplasm of uncertain behavior of skin: Secondary | ICD-10-CM | POA: Diagnosis not present

## 2014-08-16 DIAGNOSIS — Z85828 Personal history of other malignant neoplasm of skin: Secondary | ICD-10-CM | POA: Diagnosis not present

## 2014-08-16 DIAGNOSIS — D1801 Hemangioma of skin and subcutaneous tissue: Secondary | ICD-10-CM | POA: Diagnosis not present

## 2014-08-24 ENCOUNTER — Other Ambulatory Visit: Payer: Self-pay | Admitting: Dermatology

## 2014-08-24 DIAGNOSIS — D225 Melanocytic nevi of trunk: Secondary | ICD-10-CM | POA: Diagnosis not present

## 2014-08-24 DIAGNOSIS — Z85828 Personal history of other malignant neoplasm of skin: Secondary | ICD-10-CM | POA: Diagnosis not present

## 2014-08-24 DIAGNOSIS — D485 Neoplasm of uncertain behavior of skin: Secondary | ICD-10-CM | POA: Diagnosis not present

## 2014-09-25 DIAGNOSIS — H5203 Hypermetropia, bilateral: Secondary | ICD-10-CM | POA: Diagnosis not present

## 2014-09-25 DIAGNOSIS — H43813 Vitreous degeneration, bilateral: Secondary | ICD-10-CM | POA: Diagnosis not present

## 2014-09-25 DIAGNOSIS — H2513 Age-related nuclear cataract, bilateral: Secondary | ICD-10-CM | POA: Diagnosis not present

## 2014-09-25 DIAGNOSIS — H52203 Unspecified astigmatism, bilateral: Secondary | ICD-10-CM | POA: Diagnosis not present

## 2014-11-24 ENCOUNTER — Telehealth: Payer: Self-pay | Admitting: *Deleted

## 2014-11-24 NOTE — Telephone Encounter (Signed)
Please give the pt a call she has question about piliates...td

## 2014-12-08 DIAGNOSIS — R509 Fever, unspecified: Secondary | ICD-10-CM | POA: Diagnosis not present

## 2014-12-08 DIAGNOSIS — Z6830 Body mass index (BMI) 30.0-30.9, adult: Secondary | ICD-10-CM | POA: Diagnosis not present

## 2014-12-08 DIAGNOSIS — R05 Cough: Secondary | ICD-10-CM | POA: Diagnosis not present

## 2014-12-08 DIAGNOSIS — J209 Acute bronchitis, unspecified: Secondary | ICD-10-CM | POA: Diagnosis not present

## 2014-12-18 DIAGNOSIS — R8290 Unspecified abnormal findings in urine: Secondary | ICD-10-CM | POA: Diagnosis not present

## 2014-12-18 DIAGNOSIS — E785 Hyperlipidemia, unspecified: Secondary | ICD-10-CM | POA: Diagnosis not present

## 2014-12-18 DIAGNOSIS — M859 Disorder of bone density and structure, unspecified: Secondary | ICD-10-CM | POA: Diagnosis not present

## 2014-12-18 DIAGNOSIS — N390 Urinary tract infection, site not specified: Secondary | ICD-10-CM | POA: Diagnosis not present

## 2014-12-21 DIAGNOSIS — Z6830 Body mass index (BMI) 30.0-30.9, adult: Secondary | ICD-10-CM | POA: Diagnosis not present

## 2014-12-21 DIAGNOSIS — E785 Hyperlipidemia, unspecified: Secondary | ICD-10-CM | POA: Diagnosis not present

## 2014-12-21 DIAGNOSIS — M1990 Unspecified osteoarthritis, unspecified site: Secondary | ICD-10-CM | POA: Diagnosis not present

## 2014-12-21 DIAGNOSIS — Z1389 Encounter for screening for other disorder: Secondary | ICD-10-CM | POA: Diagnosis not present

## 2014-12-21 DIAGNOSIS — M859 Disorder of bone density and structure, unspecified: Secondary | ICD-10-CM | POA: Diagnosis not present

## 2014-12-21 DIAGNOSIS — Z0000 Encounter for general adult medical examination without abnormal findings: Secondary | ICD-10-CM | POA: Diagnosis not present

## 2014-12-21 DIAGNOSIS — Z23 Encounter for immunization: Secondary | ICD-10-CM | POA: Diagnosis not present

## 2014-12-21 DIAGNOSIS — E669 Obesity, unspecified: Secondary | ICD-10-CM | POA: Diagnosis not present

## 2014-12-27 DIAGNOSIS — Z1212 Encounter for screening for malignant neoplasm of rectum: Secondary | ICD-10-CM | POA: Diagnosis not present

## 2015-01-10 ENCOUNTER — Ambulatory Visit (INDEPENDENT_AMBULATORY_CARE_PROVIDER_SITE_OTHER): Payer: Medicare Other | Admitting: Family Medicine

## 2015-01-10 ENCOUNTER — Ambulatory Visit (INDEPENDENT_AMBULATORY_CARE_PROVIDER_SITE_OTHER): Payer: Medicare Other

## 2015-01-10 VITALS — BP 140/82 | HR 109 | Temp 100.8°F | Resp 18 | Ht 67.0 in | Wt 186.8 lb

## 2015-01-10 DIAGNOSIS — R509 Fever, unspecified: Secondary | ICD-10-CM

## 2015-01-10 DIAGNOSIS — R197 Diarrhea, unspecified: Secondary | ICD-10-CM

## 2015-01-10 DIAGNOSIS — R110 Nausea: Secondary | ICD-10-CM | POA: Diagnosis not present

## 2015-01-10 DIAGNOSIS — R1030 Lower abdominal pain, unspecified: Secondary | ICD-10-CM | POA: Diagnosis not present

## 2015-01-10 LAB — POCT CBC
GRANULOCYTE PERCENT: 80.6 % — AB (ref 37–80)
HCT, POC: 47.1 % (ref 37.7–47.9)
Hemoglobin: 15.9 g/dL (ref 12.2–16.2)
LYMPH, POC: 0.8 (ref 0.6–3.4)
MCH: 30.5 pg (ref 27–31.2)
MCHC: 33.7 g/dL (ref 31.8–35.4)
MCV: 90.6 fL (ref 80–97)
MID (cbc): 0.3 (ref 0–0.9)
MPV: 7.6 fL (ref 0–99.8)
PLATELET COUNT, POC: 185 10*3/uL (ref 142–424)
POC Granulocyte: 4.5 (ref 2–6.9)
POC LYMPH %: 13.6 % (ref 10–50)
POC MID %: 5.8 % (ref 0–12)
RBC: 5.2 M/uL (ref 4.04–5.48)
RDW, POC: 12.8 %
WBC: 5.6 10*3/uL (ref 4.6–10.2)

## 2015-01-10 LAB — POCT URINALYSIS DIPSTICK
BILIRUBIN UA: NEGATIVE
Blood, UA: NEGATIVE
Glucose, UA: NEGATIVE
Ketones, UA: NEGATIVE
Leukocytes, UA: NEGATIVE
Nitrite, UA: NEGATIVE
PH UA: 5
Protein, UA: NEGATIVE
SPEC GRAV UA: 1.025
Urobilinogen, UA: 0.2

## 2015-01-10 LAB — POCT UA - MICROSCOPIC ONLY
Casts, Ur, LPF, POC: NEGATIVE
Crystals, Ur, HPF, POC: NEGATIVE
MUCUS UA: NEGATIVE
YEAST UA: NEGATIVE

## 2015-01-10 MED ORDER — ACETAMINOPHEN 325 MG PO TABS
1000.0000 mg | ORAL_TABLET | Freq: Once | ORAL | Status: AC
  Administered 2015-01-10: 975 mg via ORAL

## 2015-01-10 MED ORDER — ONDANSETRON 4 MG PO TBDP
8.0000 mg | ORAL_TABLET | Freq: Once | ORAL | Status: AC
Start: 2015-01-10 — End: 2015-01-10
  Administered 2015-01-10: 8 mg via ORAL

## 2015-01-10 NOTE — Progress Notes (Signed)
Subjective:  Patient ID: Susan Bruce, female    DOB: 02/08/1941  Age: 74 y.o. MRN: [id number]  74 year old lady who felt a little bit fatigued yesterday. This morning she got up and was feeling the same way. Later she developed some abdominal symptoms. She had a couple of normal bowel movements, then had a couple of diarrheal bowel movements. She did not seen any blood though she is raw and bottom. She got queasy but did not actually vomit. She started running a fever and hurting across the lower part of the abdomen just below the umbilicus. A few years ago she had similar symptoms when she ruptured her appendix, so she was concerned. She had a colonoscopy 5 or 6 years ago with no evidence of diverticulitis. She generally is a healthy lady and not on any regular medications. No cough. No GU complaints. She is a little achy.  Past family social history reviewed   Objective:   Warm to touch. Chest clear. Heart regular. Abdomen has normal bowel sounds. Soft without organomegaly or masses. Tender across the low abdomen to below the umbilicus but more on the left side. No rebound.  Results for orders placed or performed in visit on 01/10/15  POCT CBC  Result Value Ref Range   WBC 5.6 4.6 - 10.2 K/uL   Lymph, poc 0.8 0.6 - 3.4   POC LYMPH PERCENT 13.6 10 - 50 %L   MID (cbc) 0.3 0 - 0.9   POC MID % 5.8 0 - 12 %M   POC Granulocyte 4.5 2 - 6.9   Granulocyte percent 80.6 (A) 37 - 80 %G   RBC 5.20 4.04 - 5.48 M/uL   Hemoglobin 15.9 12.2 - 16.2 g/dL   HCT, POC 47.1 37.7 - 47.9 %   MCV 90.6 80 - 97 fL   MCH, POC 30.5 27 - 31.2 pg   MCHC 33.7 31.8 - 35.4 g/dL   RDW, POC 12.8 %   Platelet Count, POC 185 142 - 424 K/uL   MPV 7.6 0 - 99.8 fL  POCT UA - Microscopic Only  Result Value Ref Range   WBC, Ur, HPF, POC 0-1    RBC, urine, microscopic 0-3    Bacteria, U Microscopic trace    Mucus, UA neg    Epithelial cells, urine per micros 0-2    Crystals, Ur, HPF, POC neg    Casts, Ur, LPF, POC neg     Yeast, UA neg   POCT urinalysis dipstick  Result Value Ref Range   Color, UA yellow    Clarity, UA clear    Glucose, UA neg    Bilirubin, UA neg    Ketones, UA neg    Spec Grav, UA 1.025    Blood, UA neg    pH, UA 5.0    Protein, UA neg    Urobilinogen, UA 0.2    Nitrite, UA neg    Leukocytes, UA Negative    [facility] reading (PRIMARY) by  Dr. Linna Darner Minimal air-fluid level but otherwise normal abdominal series. No free air. No loops of bowel..    Assessment & Plan:   Assessment: Abdominal pain Diarrhea Nausea Fever Gastroenteritis  Plan:  Ondansetron for nausea Tylenol given her for the fever See instructions Patient Instructions  Drink plenty of fluids as discussed  If diarrhea persists after 24 hours or so you can take some Imodium  Return or go to the emergency room if increased abdominal pain, high fevers, not able to keep  liquids down, blood in the stool or anything else of significant increasing concern.     Atalaya Zappia, MD 01/10/2015

## 2015-01-10 NOTE — Patient Instructions (Signed)
Drink plenty of fluids as discussed  If diarrhea persists after 24 hours or so you can take some Imodium  Return or go to the emergency room if increased abdominal pain, high fevers, not able to keep liquids down, blood in the stool or anything else of significant increasing concern.

## 2015-01-11 ENCOUNTER — Other Ambulatory Visit (HOSPITAL_COMMUNITY): Payer: Self-pay

## 2015-01-11 ENCOUNTER — Emergency Department (HOSPITAL_COMMUNITY): Payer: Medicare Other

## 2015-01-11 ENCOUNTER — Observation Stay (HOSPITAL_COMMUNITY)
Admission: EM | Admit: 2015-01-11 | Discharge: 2015-01-12 | Disposition: A | Discharge status: Home / Self Care | Payer: Medicare Other | Attending: Cardiology | Admitting: Cardiology

## 2015-01-11 ENCOUNTER — Encounter (HOSPITAL_COMMUNITY): Payer: Self-pay | Admitting: Emergency Medicine

## 2015-01-11 DIAGNOSIS — R079 Chest pain, unspecified: Secondary | ICD-10-CM | POA: Diagnosis not present

## 2015-01-11 DIAGNOSIS — R1030 Lower abdominal pain, unspecified: Secondary | ICD-10-CM | POA: Diagnosis not present

## 2015-01-11 DIAGNOSIS — R61 Generalized hyperhidrosis: Secondary | ICD-10-CM | POA: Insufficient documentation

## 2015-01-11 DIAGNOSIS — R110 Nausea: Secondary | ICD-10-CM | POA: Diagnosis not present

## 2015-01-11 DIAGNOSIS — R509 Fever, unspecified: Secondary | ICD-10-CM | POA: Diagnosis not present

## 2015-01-11 DIAGNOSIS — Z880 Allergy status to penicillin: Secondary | ICD-10-CM | POA: Diagnosis not present

## 2015-01-11 DIAGNOSIS — Z85828 Personal history of other malignant neoplasm of skin: Secondary | ICD-10-CM | POA: Insufficient documentation

## 2015-01-11 DIAGNOSIS — R072 Precordial pain: Secondary | ICD-10-CM

## 2015-01-11 DIAGNOSIS — R05 Cough: Secondary | ICD-10-CM | POA: Diagnosis not present

## 2015-01-11 DIAGNOSIS — I447 Left bundle-branch block, unspecified: Secondary | ICD-10-CM | POA: Diagnosis present

## 2015-01-11 DIAGNOSIS — Z8719 Personal history of other diseases of the digestive system: Secondary | ICD-10-CM | POA: Insufficient documentation

## 2015-01-11 DIAGNOSIS — R197 Diarrhea, unspecified: Secondary | ICD-10-CM | POA: Diagnosis not present

## 2015-01-11 DIAGNOSIS — R0789 Other chest pain: Secondary | ICD-10-CM | POA: Diagnosis not present

## 2015-01-11 LAB — URINALYSIS, ROUTINE W REFLEX MICROSCOPIC
Bilirubin Urine: NEGATIVE
GLUCOSE, UA: NEGATIVE mg/dL
Hgb urine dipstick: NEGATIVE
Ketones, ur: NEGATIVE mg/dL
LEUKOCYTES UA: NEGATIVE
NITRITE: NEGATIVE
PH: 5.5 (ref 5.0–8.0)
Protein, ur: NEGATIVE mg/dL
Specific Gravity, Urine: >1.03 — ABNORMAL HIGH (ref 1.005–1.030)
Urobilinogen, UA: 0.2 mg/dL (ref 0.0–1.0)

## 2015-01-11 LAB — COMPREHENSIVE METABOLIC PANEL
ALT: 32 U/L (ref 14–54)
AST: 22 U/L (ref 15–41)
Albumin: 3.5 g/dL (ref 3.5–5.0)
Alkaline Phosphatase: 46 U/L (ref 38–126)
Anion gap: 6 (ref 5–15)
BILIRUBIN TOTAL: 0.9 mg/dL (ref 0.3–1.2)
BUN: 9 mg/dL (ref 6–20)
CO2: 24 mmol/L (ref 22–32)
Calcium: 9.2 mg/dL (ref 8.9–10.3)
Chloride: 106 mmol/L (ref 101–111)
Creatinine, Ser: 0.78 mg/dL (ref 0.44–1.00)
GFR calc Af Amer: >60 mL/min (ref >60)
GFR calc non Af Amer: >60 mL/min (ref >60)
GLUCOSE: 130 mg/dL — AB (ref 65–99)
POTASSIUM: 3.7 mmol/L (ref 3.5–5.1)
Sodium: 136 mmol/L (ref 135–145)
Total Protein: 6.3 g/dL — ABNORMAL LOW (ref 6.5–8.1)

## 2015-01-11 LAB — I-STAT TROPONIN, ED
Troponin i, poc: 0 ng/mL (ref 0.00–0.08)
Troponin i, poc: 0 ng/mL (ref 0.00–0.08)

## 2015-01-11 LAB — CBC
HCT: 43 % (ref 36.0–46.0)
HEMOGLOBIN: 14.8 g/dL (ref 12.0–15.0)
MCH: 31.2 pg (ref 26.0–34.0)
MCHC: 34.4 g/dL (ref 30.0–36.0)
MCV: 90.7 fL (ref 78.0–100.0)
PLATELETS: 133 10*3/uL — AB (ref 150–400)
RBC: 4.74 MIL/uL (ref 3.87–5.11)
RDW: 12.6 % (ref 11.5–15.5)
WBC: 4 10*3/uL (ref 4.0–10.5)

## 2015-01-11 LAB — TSH: TSH: 1.179 u[IU]/mL (ref 0.350–4.500)

## 2015-01-11 LAB — TROPONIN I
Troponin I: <0.03 ng/mL (ref <0.031)
Troponin I: <0.03 ng/mL (ref <0.031)

## 2015-01-11 LAB — LIPASE, BLOOD: LIPASE: 16 U/L — AB (ref 22–51)

## 2015-01-11 MED ORDER — ASPIRIN EC 81 MG PO TBEC
81.0000 mg | DELAYED_RELEASE_TABLET | Freq: Every day | ORAL | Status: DC
  Administered 2015-01-12: 81 mg via ORAL
  Filled 2015-01-11: qty 1

## 2015-01-11 MED ORDER — IOHEXOL 300 MG/ML  SOLN
100.0000 mL | Freq: Once | INTRAMUSCULAR | Status: AC | PRN
  Administered 2015-01-11: 100 mL via INTRAVENOUS

## 2015-01-11 MED ORDER — ACETAMINOPHEN 325 MG PO TABS
650.0000 mg | ORAL_TABLET | ORAL | Status: DC | PRN
  Administered 2015-01-11: 650 mg via ORAL
  Filled 2015-01-11: qty 2

## 2015-01-11 MED ORDER — HEPARIN SODIUM (PORCINE) 5000 UNIT/ML IJ SOLN
5000.0000 [IU] | Freq: Three times a day (TID) | INTRAMUSCULAR | Status: DC
  Administered 2015-01-11 – 2015-01-12 (×2): 5000 [IU] via SUBCUTANEOUS
  Filled 2015-01-11 (×2): qty 1

## 2015-01-11 MED ORDER — IOHEXOL 300 MG/ML  SOLN
25.0000 mL | Freq: Once | INTRAMUSCULAR | Status: AC | PRN
  Administered 2015-01-11: 25 mL via ORAL

## 2015-01-11 MED ORDER — ONDANSETRON HCL 4 MG/2ML IJ SOLN: 4.0000 mg | Freq: Four times a day (QID) | INTRAMUSCULAR | Status: DC | PRN

## 2015-01-11 MED ORDER — NITROGLYCERIN 0.4 MG SL SUBL: 0.4000 mg | SUBLINGUAL_TABLET | SUBLINGUAL | Status: DC | PRN

## 2015-01-11 NOTE — ED Notes (Signed)
3W APPT @ 2761

## 2015-01-11 NOTE — ED Provider Notes (Signed)
CSN: [id number]     Arrival date & time 01/11/15  1016 History   First MD Initiated Contact with Patient 01/11/15 1025     Chief Complaint  Patient presents with  . Chest Pain     (Consider location/radiation/quality/duration/timing/severity/associated sxs/prior Treatment) HPI Comments: Went to Urgent Care last night with fever, abdominal pain, one episode of diarrhea. Had negative xrays at that time.  Fever resolved this morning. While resting, patient had episode of chest pain. She cannot describe it. Associated diaphoresis.   Patient is a 74 y.o. female presenting with chest pain.  Chest Pain Pain location:  L chest Pain quality: not tearing   Pain quality comment:  "hurts" Pain radiates to:  Upper back and R jaw Pain radiates to the back: yes   Pain severity:  Moderate Onset quality:  Sudden Timing:  Constant Progression:  Resolved Chronicity:  New Context: at rest   Relieved by:  Nothing Worsened by:  Nothing tried Associated symptoms: fever (last night)   Associated symptoms: no cough and no shortness of breath     Past Medical History  Diagnosis Date  . Arthritis   . Cancer     Skin cancer- basal- leg   Past Surgical History  Procedure Laterality Date  . Appendectomy      open incision  . Hernia repair      incisional - after appendectomy  . Small intestine surgery      with appendectomy  . Knee arthroscopy w/ medial collateral ligament (mcl) repair Right   . Dilation and curettage of uterus     Family History  Problem Relation Age of Onset  . Aortic stenosis Mother   . Atrial fibrillation Mother   . Neuropathy Mother   . Melanoma Father   . Cancer Paternal Grandmother    History  Substance Use Topics  . Smoking status: Never Smoker   . Smokeless tobacco: Not on file  . Alcohol Use: 3.0 oz/week    5 Glasses of wine per week   OB History    No data available     Review of Systems  Constitutional: Positive for fever (last night).   Respiratory: Negative for cough and shortness of breath.   Cardiovascular: Positive for chest pain.  All other systems reviewed and are negative.     Allergies  Penicillins  Home Medications   Prior to Admission medications   Not on File   BP 165/70 mmHg  Pulse 96  Temp(Src) 99.5 F (37.5 C) (Oral)  Resp 18  Ht 5\' 6"  (1.676 m)  Wt 186 lb (84.369 kg)  BMI 30.04 kg/m2  SpO2 98% Physical Exam  Constitutional: She is oriented to person, place, and time. She appears well-developed and well-nourished. No distress.  HENT:  Head: Normocephalic and atraumatic.  Mouth/Throat: Oropharynx is clear and moist.  Eyes: EOM are normal. Pupils are equal, round, and reactive to light.  Neck: Normal range of motion. Neck supple.  Cardiovascular: Normal rate and regular rhythm.  Exam reveals no friction rub.   No murmur heard. Pulmonary/Chest: Effort normal and breath sounds normal. No respiratory distress. She has no wheezes. She has no rales.  Abdominal: Soft. She exhibits no distension. There is tenderness (mild lower). There is no rebound.  Musculoskeletal: Normal range of motion. She exhibits no edema.  Neurological: She is alert and oriented to person, place, and time.  Skin: No rash noted. She is not diaphoretic.  Nursing note and vitals reviewed.   ED Course  Procedures (including critical care time) Labs Review Labs Reviewed  CBC  COMPREHENSIVE METABOLIC PANEL  LIPASE, BLOOD  URINALYSIS, ROUTINE W REFLEX MICROSCOPIC (NOT AT [facility])  Randolm Idol, ED    Imaging Review Dg Abd Acute W/chest  01/11/2015   CLINICAL DATA:  Fever, nausea, lower abdominal pain  EXAM: DG ABDOMEN ACUTE W/ 1V CHEST  COMPARISON:  01/28/2013  FINDINGS: Cardiomediastinal silhouette is stable. No acute infiltrate or pleural effusion. No pulmonary edema. There is normal small bowel gas pattern. Post hernia repair changes noted lower abdominal wall. No free abdominal air.  IMPRESSION: Negative abdominal  radiographs.  No acute cardiopulmonary disease.   Electronically Signed   By: Lahoma Crocker M.D.   On: 01/11/2015 08:56     EKG Interpretation   Date/Time:  Thursday Jan 11 2015 10:22:51 EDT Ventricular Rate:  88 PR Interval:  160 QRS Duration: 118 QT Interval:  388 QTC Calculation: 469 R Axis:   9 Text Interpretation:  Normal sinus rhythm Low voltage QRS Cannot rule out  Anterior infarct , age undetermined Abnormal ECG No significant change  since last tracing Confirmed by Mingo Amber  MD, [place] ([id number]) on 01/11/2015  10:27:21 AM      MDM   Final diagnoses:  Chest pain    23F here with chest pain, abdominal pain, fever. Patient seen last night for fever, abdominal pain. Patient had episode of chest pain, gone now. Did radiate to her back. Associated diaphoresis.  Patient's EKG here similar to prior. Mild lower abdominal pain. Will plan on Cards w/u, CT abdomen.  Cardiology w/u negative, patient's CT normal. Cardiology consulted and will admit.    Evelina Bucy, MD 01/11/15 [phone]

## 2015-01-11 NOTE — Progress Notes (Signed)
Pt admitted from ED. Report received. VSS; no c/o pain. Tele in place. Pt and family oriented to room. Bed is low and brakes are locked. Call light within reach. Will continue to monitor.

## 2015-01-11 NOTE — ED Notes (Signed)
To ED with c/o chest pain, back pain radiates to right side of jaw, right arm-- was seen last night at [facility] for GI symptoms. States back pain is the worst.

## 2015-01-11 NOTE — ED Notes (Signed)
Pt ambulated to the bathroom with ease 

## 2015-01-11 NOTE — ED Notes (Signed)
MD at bedside. 

## 2015-01-11 NOTE — ED Notes (Signed)
Pt and family made aware of bed assignment 

## 2015-01-11 NOTE — H&P (Signed)
Patient ID: LUZELENA HEEG MRN: [id number], DOB/AGE: 1941-03-31   Admit date: 01/11/2015   Primary Physician: Geoffery Lyons, MD Primary Cardiologist: New - Dr. Stanford Breed  Pt. Profile:  74 year old Caucasian female with no significant past medical history other than ruptured appendix more than 12 years ago and hernia repair present to [facility] on 5/26 with chest pain. She was seen at local urgent care on 5/25 for possible fever and diarrhea in the setting of gastroenteritis  Problem List  Past Medical History  Diagnosis Date  . Arthritis   . Cancer     Skin cancer- basal- leg    Past Surgical History  Procedure Laterality Date  . Appendectomy      open incision  . Hernia repair      incisional - after appendectomy  . Small intestine surgery      with appendectomy  . Knee arthroscopy w/ medial collateral ligament (mcl) repair Right   . Dilation and curettage of uterus       Allergies  Allergies  Allergen Reactions  . Penicillins     REACTION: rash    HPI  The patient is a pleasant 74 year old Caucasian female with no significant past medical history other than ruptured appendix more than 12 years ago. She states after her ruptured appendix, she had have an incisional hernia repaired at a later time. She denies any past cardiac history and prior cardiac workup. She has been doing well at home and has been doing cardio workout 3 times a week. According to the patient, each time she work out for 45-50 minutes. She has never experienced any chest pain before. She denies any recent sick contact. She was in her usual state of health before yesterday morning. She denies any recent lower extremity edema other than ankle edema, orthopnea or paroxysmal nocturnal dyspnea.   She woke up in the morning of 01/09/2014 feeling bad. She also endorsed a fever and abdominal discomfort. She was only able to exercise for roughly 20 minutes. She contacted her primary care doctor who instructed to  her to go to the nearest ED. She eventually decided to seek medical attention at local urgent care. At the urgent care location, she was noted to have a fever of 100.8. She was also having diarrhea at the time. She was eventually discharged from urgent care with a diagnosis of gastroenteritis and instructed to take Tylenol for fever. Overnight, her abdominal discomfort did improve however never completely went away. She started having intermittent chest discomfort on the night of 5/25 however it wasn't significant enough to warrant her to seek medical attention. She was sitting down reading newspaper around 9 AM in the morning of 5/26 when she suddenly noted significant upper back pain and anterior substernal pain. The pain radiated to her right jaw. She also endorsed diaphoresis, however no shortness of breath. She states she has never experienced this type of pain before even during yesterday morning's activity. She eventually decided to seek medical attention at [facility] ED on 5/26. By the time she was transporting to the ED, majority of her symptom has resolved.  On arrival her blood pressure was 165/70. Heart rate 96. Temperature 99.5. O2 saturation 98%. Her CBC and BMET were largely normal. Troponin negative. Urinalysis showed cloudy appearance urine, however no obvious sign of UTI. Chest x-ray was negative. EKG showed normal sinus rhythm with left bundle branch block, poor R wave progression in the anterior lead. Unfortunately there is no prior EKG  to compare. 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 Cardiology consulted for chest pain.   Home Medications  Prior to  Admission medications   Not on File    Family History  Family History  Problem Relation Age of Onset  . Aortic stenosis Mother   . Atrial fibrillation Mother   . Neuropathy Mother   . Melanoma Father   . Cancer Paternal Grandmother     Social History  History   Social History  . Marital Status: Married    Spouse Name: N/A  . Number of Children: N/A  . Years of Education: N/A   Occupational History  . Not on file.   Social History Main Topics  . Smoking status: Never Smoker   . Smokeless tobacco: Not on file  . Alcohol Use: 3.0 oz/week    5 Glasses of wine per week  . Drug Use: No  . Sexual Activity: Not on file     Comment: married   Other Topics Concern  . Not on file   Social History Narrative     Review of Systems General:  No chills, night sweats or weight changes. + fever Cardiovascular:  No dyspnea on exertion, edema, orthopnea, palpitations, paroxysmal nocturnal dyspnea. +chest pain, diaphoresis, upper back pain, Right jaw pain Dermatological: No rash, lesions/masses Respiratory: No cough, dyspnea Urologic: No hematuria, dysuria Abdominal:   No nausea, vomiting, bright red blood per rectum, melena, or hematemesis +diarrhea Neurologic:  No visual changes, wkns, changes in mental status. All other systems reviewed and are otherwise negative except as noted above.  Physical Exam  Blood pressure 126/75, pulse 80, temperature 99.5 F (37.5 C), temperature source Oral, resp. rate 15, height 5\' 6"  (1.676 m), weight 186 lb (84.369 kg), SpO2 97 %.  General: Pleasant, NAD Psych: Normal affect. Neuro: Alert and oriented X 3. Moves all extremities spontaneously. HEENT: Normal  Neck: Supple without bruits or JVD. Lungs:  Resp regular and unlabored, CTA. Heart: RRR no s3, s4, or murmurs. Abdomen: Soft, non-tender, non-distended, BS + x 4.  Extremities: No clubbing, cyanosis or edema. DP/PT/Radials 2+ and equal bilaterally.  Labs  Troponin [facility]  Test)  Recent Labs  01/11/15 1059  TROPIPOC 0.00   No results for input(s): CKTOTAL, CKMB, TROPONINI in the last 72 hours. Lab Results  Component Value Date   WBC 4.0 01/11/2015   HGB 14.8 01/11/2015   HCT 43.0 01/11/2015   MCV 90.7 01/11/2015   PLT 133* 01/11/2015     Recent Labs Lab 01/11/15 1100  NA 136  K 3.7  CL 106  CO2 24  BUN 9  CREATININE 0.78  CALCIUM 9.2  PROT 6.3*  BILITOT 0.9  ALKPHOS 46  ALT 32  AST 22  GLUCOSE 130*    Radiology/Studies  Dg Chest 2 View  01/11/2015   CLINICAL DATA:  74 year old female with cough for 1 month. Chest pain today.  EXAM: CHEST  2 VIEW  COMPARISON:  01/10/2015 and prior chest radiographs  FINDINGS: The cardiomediastinal silhouette is unremarkable.  There is no evidence of focal airspace disease, pulmonary edema, suspicious pulmonary nodule/mass, pleural effusion, or pneumothorax. No acute bony abnormalities are identified.  IMPRESSION: No active cardiopulmonary disease.  Electronically Signed   By: Margarette Canada M.D.   On: 01/11/2015 12:06   Ct Abdomen Pelvis W Contrast  01/11/2015   CLINICAL DATA:  Lower abdominal pain, nausea and diarrhea, history of ruptured appendix, history of hernia repair  EXAM: CT ABDOMEN AND PELVIS WITH CONTRAST  TECHNIQUE: Multidetector CT imaging of the abdomen and pelvis was performed using the standard protocol following bolus administration of intravenous contrast.  CONTRAST:  140mL OMNIPAQUE IOHEXOL 300 MG/ML  SOLN  COMPARISON:  CT scan report 12/06/2002 no images available  FINDINGS: The lung bases are unremarkable. Sagittal images of the spine shows degenerative changes thoracolumbar spine. Mild hepatic fatty infiltration. No intrahepatic biliary ductal dilatation. There is a probable hemangioma inferior aspect of the right hepatic lobe measures 2.3 cm. Enhanced pancreas, spleen and adrenal glands are unremarkable. Atherosclerotic calcifications of distal abdominal aorta and splenic artery. Kidneys are  symmetrical in size and enhancement. No hydronephrosis or hydroureter.  Delayed renal images shows bilateral renal symmetrical excretion. Bilateral visualized proximal ureter is unremarkable.  No aortic aneurysm.  No small bowel obstruction. Postsurgical changes post hernia repair with mesh material in place noted lower abdominal wall.  There is no pericecal inflammation. The patient is status post appendectomy. No ascites or free air. No adenopathy. The uterus and adnexa are unremarkable.  In axial image 77 there is a small left paramedian ventral hernia in anterior pelvic wall just below the level of the mesh measures 4.5 x 2.1 cm. It is containing small amount of fat and a small segment of left anterior wall of the urinary bladder. There is mild thickening of bladder wall and minimal stranding of surrounding fat within hernia. Mild congested vessels are noted within hernia best seen in sagittal image 57. Mild inflammatory changes at this level cannot be excluded and clinical correlation is necessary. There is no evidence of fluid within hernia to suggest urine extravasation.  IMPRESSION: 1. No acute inflammatory process within abdomen. 2. No hydronephrosis or hydroureter. Bilateral renal symmetrical excretion. 3. There is a probable hemangioma right hepatic lobe inferiorly measures 2.3 cm. 4. No small bowel obstruction.  No ascites or free air. 5. Postsurgical changes post hernia repair with mesh material in place lower anterior abdominal wall. In axial image 77 there is a small left paramedian ventral hernia in anterior pelvic wall just below the level of the mesh measures 4.5 x 2.1 cm. It is containing small amount of fat and a small segment of left anterior wall of the urinary bladder. There is mild thickening of bladder wall and minimal stranding of surrounding fat within hernia. Mild congested vessels are noted within hernia best seen in sagittal image 57. Mild inflammatory changes at this level cannot be  excluded and clinical correlation is necessary. There is no evidence of fluid within hernia to suggest urine extravasation.   Electronically Signed   By: Lahoma Crocker M.D.   On: 01/11/2015 12:54   Dg Abd Acute W/chest  01/11/2015   CLINICAL DATA:  Fever, nausea, lower abdominal pain  EXAM: DG ABDOMEN ACUTE W/ 1V CHEST  COMPARISON:  01/28/2013  FINDINGS: Cardiomediastinal silhouette is stable. No acute infiltrate or pleural effusion. No pulmonary edema. There is normal small bowel gas pattern. Post hernia repair changes noted lower abdominal wall. No free abdominal air.  IMPRESSION: Negative abdominal radiographs.  No acute cardiopulmonary disease.   Electronically Signed   By: Lahoma Crocker M.D.   On: 01/11/2015 08:56    ECG  NSR with LBBB  and poor R wave progression in anterior lead   ASSESSMENT AND PLAN  1. Atypical chest pain x 2 hrs - resolved  - no CP during exertion, trop negative  - no significant cardiac risk factors other than age. EKG showed LBBB and poor R wave progression in anterior lead  - admit overnight to trend trop, if positive, will need cath. If negative, discharge tomorrow to obtain outpatient myoview  2. Abdomen discomfort  - CT of abdomen shows mild inflammatory changes in ventral hernia  - if worsen, may need to be seen by medicine  3. Recent fever with diarrhea  - discharged for gastroenteritis yesterday from local urgent care   Signed, Almyra Deforest, PA-C 01/11/2015, 2:51 PM As above, patient seen and examined. Briefly she is a 74 year old female with no prior cardiac history. She typically does not have dyspnea on exertion, orthopnea, PND, pedal edema, palpitations, syncope or exertional chest pain. Yesterday she developed mild abdominal discomfort followed by fever and diarrhea. Last evening she had epigastric pain for approximately 10-15 minutes. Early this morning she had recurrence of her epigastric low chest pain radiating to her back and right jaw. The pain was  described as a dull discomfort. There was associated nausea and diaphoresis but no dyspnea. The pain was not pleuritic, positional or related to food. It lasted approximately 2 hours and resolved. She is presently pain-free. Initial enzymes negative. Electrocardiogram shows sinus rhythm with incomplete left bundle branch block. Plan to admit and cycle enzymes. If negative plan outpatient nuclear study for risk stratification. Kirk Ruths

## 2015-01-12 ENCOUNTER — Other Ambulatory Visit (HOSPITAL_COMMUNITY): Payer: Self-pay | Admitting: Physician Assistant

## 2015-01-12 DIAGNOSIS — R0789 Other chest pain: Secondary | ICD-10-CM

## 2015-01-12 DIAGNOSIS — R509 Fever, unspecified: Secondary | ICD-10-CM | POA: Diagnosis not present

## 2015-01-12 DIAGNOSIS — R072 Precordial pain: Secondary | ICD-10-CM | POA: Diagnosis not present

## 2015-01-12 DIAGNOSIS — R079 Chest pain, unspecified: Secondary | ICD-10-CM | POA: Diagnosis not present

## 2015-01-12 DIAGNOSIS — R1030 Lower abdominal pain, unspecified: Secondary | ICD-10-CM | POA: Diagnosis not present

## 2015-01-12 DIAGNOSIS — I447 Left bundle-branch block, unspecified: Secondary | ICD-10-CM | POA: Diagnosis present

## 2015-01-12 DIAGNOSIS — R61 Generalized hyperhidrosis: Secondary | ICD-10-CM | POA: Diagnosis not present

## 2015-01-12 LAB — LIPID PANEL
Cholesterol: 174 mg/dL (ref 0–200)
HDL: 49 mg/dL (ref >40)
LDL Cholesterol: 102 mg/dL — ABNORMAL HIGH (ref 0–99)
Total CHOL/HDL Ratio: 3.6 RATIO
Triglycerides: 115 mg/dL (ref <150)
VLDL: 23 mg/dL (ref 0–40)

## 2015-01-12 LAB — TROPONIN I: Troponin I: <0.03 ng/mL (ref <0.031)

## 2015-01-12 LAB — HEMOGLOBIN A1C
Hgb A1c MFr Bld: 5.5 % (ref 4.8–5.6)
MEAN PLASMA GLUCOSE: 111 mg/dL

## 2015-01-12 NOTE — Discharge Summary (Signed)
Physician Discharge Summary     Cardiologist:  Crenshaw(New) Patient ID: Susan Bruce MRN: [id number] DOB/AGE: September 20, 1940 74 y.o.  Admit date: 01/11/2015 Discharge date: 01/12/2015  Admission Diagnoses:  Chest pain  Discharge Diagnoses:  Active Problems:   Chest pain   LBBB (left bundle branch block)   Discharged Condition: stable  Hospital Course:   The patient is a pleasant 74 year old Caucasian female with no significant past medical history other than ruptured appendix more than 12 years ago. She states after her ruptured appendix, she had have an incisional hernia repaired at a later time. She denies any past cardiac history and prior cardiac workup. She has been doing well at home and has been doing cardio workout 3 times a week. According to the patient, each time she work out for 45-50 minutes. She has never experienced any chest pain before. She denies any recent sick contact. She was in her usual state of health before yesterday morning. She denies any recent lower extremity edema other than ankle edema, orthopnea or paroxysmal nocturnal dyspnea.   She woke up in the morning of 01/09/2014 feeling bad. She also endorsed a fever and abdominal discomfort. She was only able to exercise for roughly 20 minutes. She contacted her primary care doctor who instructed to her to go to the nearest ED. She eventually decided to seek medical attention at local urgent care. At the urgent care location, she was noted to have a fever of 100.8. She was also having diarrhea at the time. She was eventually discharged from urgent care with a diagnosis of gastroenteritis and instructed to take Tylenol for fever. Overnight, her abdominal discomfort did improve however never completely went away. She started having intermittent chest discomfort on the night of 5/25 however it wasn't significant enough to warrant her to seek medical attention. She was sitting down reading newspaper around 9 AM in the morning of  5/26 when she suddenly noted significant upper back pain and anterior substernal pain. The pain radiated to her right jaw. She also endorsed diaphoresis, however no shortness of breath. She states she has never experienced this type of pain before even during yesterday morning's activity. She eventually decided to seek medical attention at [facility] ED on 5/26. By the time she was transporting to the ED, majority of her symptom has resolved.  On arrival her blood pressure was 165/70. Heart rate 96. Temperature 99.5. O2 saturation 98%. Her CBC and BMET were largely normal. Troponin negative. Urinalysis showed cloudy appearance urine, however no obvious sign of UTI. Chest x-ray was negative. EKG showed normal sinus rhythm with left bundle branch block, poor R wave progression in the anterior lead. Unfortunately there is no prior EKG to compare. 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   She was admitted for observation and ruled out for MI.  Chest pain resolved. She will be scheduled for a out patient adenosine stress test.  The patient was seen by Dr. Stanford Breed who felt she was stable for DC home.    Consults: None   Significant Diagnostic Studies:  CT ABDOMEN AND PELVIS WITH CONTRAST  TECHNIQUE: Multidetector CT imaging of the abdomen and pelvis was performed using the standard protocol following  bolus administration of intravenous contrast.  CONTRAST: 128mL OMNIPAQUE IOHEXOL 300 MG/ML SOLN  COMPARISON: CT scan report 12/06/2002 no images available  FINDINGS: The lung bases are unremarkable. Sagittal images of the spine shows degenerative changes thoracolumbar  spine. Mild hepatic fatty infiltration. No intrahepatic biliary ductal dilatation. There is a probable hemangioma inferior aspect of the right hepatic lobe measures 2.3 cm. Enhanced pancreas, spleen and adrenal glands are unremarkable. Atherosclerotic calcifications of distal abdominal aorta and splenic artery. Kidneys are symmetrical in size and enhancement. No hydronephrosis or hydroureter.  Delayed renal images shows bilateral renal symmetrical excretion. Bilateral visualized proximal ureter is unremarkable.  No aortic aneurysm.  No small bowel obstruction. Postsurgical changes post hernia repair with mesh material in place noted lower abdominal wall.  There is no pericecal inflammation. The patient is status post appendectomy. No ascites or free air. No adenopathy. The uterus and adnexa are unremarkable.  In axial image 77 there is a small left paramedian ventral hernia in anterior pelvic wall just below the level of the mesh measures 4.5 x 2.1 cm. It is containing small amount of fat and a small segment of left anterior wall of the urinary bladder. There is mild thickening of bladder wall and minimal stranding of surrounding fat within hernia. Mild congested vessels are noted within hernia best seen in sagittal image 57. Mild inflammatory changes at this level cannot be excluded and clinical correlation is necessary. There is no evidence of fluid within hernia to suggest urine extravasation.  IMPRESSION: 1. No acute inflammatory process within abdomen. 2. No hydronephrosis or hydroureter. Bilateral renal symmetrical excretion. 3. There is a probable hemangioma right hepatic lobe inferiorly measures 2.3 cm. 4. No small bowel obstruction. No ascites or free air. 5. Postsurgical changes post hernia repair with mesh material in place lower anterior abdominal wall. In axial image 77 there is a small left paramedian ventral hernia in anterior pelvic wall just below the  level of the mesh measures 4.5 x 2.1 cm. It is containing small amount of fat and a small segment of left anterior wall of the urinary bladder. There is mild thickening of bladder wall and minimal stranding of surrounding fat within hernia. Mild congested vessels are noted within hernia best seen in sagittal image 57. Mild inflammatory changes at this level cannot be excluded and clinical correlation is necessary. There is no evidence of fluid within hernia to suggest urine extravasation.  CHEST 2 VIEW  COMPARISON: 01/10/2015 and prior chest radiographs  FINDINGS: The cardiomediastinal silhouette is unremarkable.  There is no evidence of focal airspace disease, pulmonary edema, suspicious pulmonary nodule/mass, pleural effusion, or pneumothorax. No acute bony abnormalities are identified.  IMPRESSION: No active cardiopulmonary disease.  Treatments: See above  Discharge Exam: Blood pressure 136/80, pulse 81, temperature 98.9 F (37.2 C), temperature source Oral, resp. rate 19, height 5\' 6"  (1.676 m), weight 180 lb 3.2 oz (81.738 kg), SpO2 99 %.   Disposition: 01-Home or Self Care      Discharge Instructions    Diet - low sodium heart healthy    Complete by:  As directed             Medication List    Notice    You have not been prescribed any medications.     Follow-up Information    Follow up with Richardson Dopp, PA-C On 02/07/2015.   Specialties:  Physician Assistant, Radiology, Interventional Cardiology   Why:  10:10 AM   Contact information:   [address]  [place] [address] [phone]       Follow up with Stress test.   Why:  The office will call you to schedule the appt.  Address for the test is different from the follow up appt.    Contact information:   [address] [place] [address] [phone]     Signed: Tarri Fuller, [facility] 01/12/2015, 9:36 AM

## 2015-01-12 NOTE — Progress Notes (Signed)
    Subjective: No further CP.    Objective: Vital signs in last 24 hours: Temp:  [98.3 F (36.8 C)-100.4 F (38 C)] 98.9 F (37.2 C) (05/27 0500) Pulse Rate:  [80-96] 81 (05/27 0500) Resp:  [10-22] 19 (05/27 0500) BP: (114-165)/(41-111) 136/80 mmHg (05/27 0500) SpO2:  [95 %-99 %] 99 % (05/27 0500) Weight:  [180 lb 3.2 oz (81.738 kg)-186 lb (84.369 kg)] 180 lb 3.2 oz (81.738 kg) (05/27 0500) Last BM Date: 01/10/15  Intake/Output from previous day:   Intake/Output this shift:    Medications Scheduled Meds: . aspirin EC  81 mg Oral Daily  . heparin  5,000 Units Subcutaneous 3 times per day   Continuous Infusions:  PRN Meds:.acetaminophen, nitroGLYCERIN, ondansetron (ZOFRAN) IV  PE: General appearance: alert, cooperative and no distress Lungs: clear to auscultation bilaterally Heart: regular rate and rhythm, S1, S2 normal, no murmur, click, rub or gallop Extremities: No LEE Pulses: 2+ and symmetric Skin: Warm and dry Neurologic: Grossly normal  Lab Results:   Recent Labs  01/10/15 2053 01/11/15 1100  WBC 5.6 4.0  HGB 15.9 14.8  HCT 47.1 43.0  PLT  --  133*   BMET  Recent Labs  01/11/15 1100  NA 136  K 3.7  CL 106  CO2 24  GLUCOSE 130*  BUN 9  CREATININE 0.78  CALCIUM 9.2   PT/INR No results for input(s): LABPROT, INR in the last 72 hours. Cholesterol  Recent Labs  01/12/15 0535  CHOL 174   Lipid Panel     Component Value Date/Time   CHOL 174 01/12/2015 0535   TRIG 115 01/12/2015 0535   HDL 49 01/12/2015 0535   CHOLHDL 3.6 01/12/2015 0535   VLDL 23 01/12/2015 0535   LDLCALC 102* 01/12/2015 0535   Cardiac Panel (last 3 results)  Recent Labs  01/11/15 2025 01/11/15 2241 01/12/15 0525  TROPONINI <0.03 <0.03 <0.03    Assessment/Plan 74 year old Caucasian female with no significant past medical history other than ruptured appendix more than 12 years ago and hernia repair present to [facility] on 5/26 with chest pain.  Active Problems:   Chest pain   LBBB  CP resolved.  She has ruled out for MI.  BP stable. Afebrile.  Will discharge this morning with OP stress test.     HAGER, BRYAN PA-C 01/12/2015 7:38 AM  As above, patient seen and examined. No further chest pain. Enzymes negative. Plan discharge this morning with outpatient stress nuclear study. If no ischemia and LV function preserved will not pursue further cardiac evaluation. Low-grade fever noted last evening. Given recent diarrhea and GI symptoms most likely related to viral gastroenteritis. She is asymptomatic at present. >30 min PA and physician time  D2 Kirk Ruths

## 2015-01-16 ENCOUNTER — Telehealth: Payer: Self-pay | Admitting: Cardiology

## 2015-01-16 NOTE — Telephone Encounter (Signed)
Patient contacted regarding discharge from [facility] on 5/27  Patient understands to follow up with provider Richardson Dopp on 02/07/15 at 10:10 at [facility]. Patient understands discharge instructions? Yes Patient understands medications and regiment? Yes (no changes) Patient understands to bring all medications to this visit? Yes (n/a)  Additionally, discussed time, location to present for stress test on 01/25/15. Number given for [facility] office to call for any questions.

## 2015-01-16 NOTE — Telephone Encounter (Signed)
Needs a Discharge phone call . Thanks

## 2015-01-23 ENCOUNTER — Telehealth (HOSPITAL_COMMUNITY): Payer: Self-pay

## 2015-01-23 NOTE — Telephone Encounter (Signed)
Encounter complete. 

## 2015-01-25 ENCOUNTER — Encounter: Payer: Self-pay | Admitting: Physician Assistant

## 2015-01-25 ENCOUNTER — Ambulatory Visit (HOSPITAL_COMMUNITY)
Admission: RE | Admit: 2015-01-25 | Discharge: 2015-01-25 | Disposition: A | Discharge status: Home / Self Care | Payer: Medicare Other | Source: Ambulatory Visit | Attending: Cardiovascular Disease | Admitting: Cardiovascular Disease

## 2015-01-25 DIAGNOSIS — R0789 Other chest pain: Secondary | ICD-10-CM | POA: Diagnosis not present

## 2015-01-25 DIAGNOSIS — I447 Left bundle-branch block, unspecified: Secondary | ICD-10-CM | POA: Insufficient documentation

## 2015-01-25 LAB — MYOCARDIAL PERFUSION IMAGING
CHL CUP RESTING HR STRESS: 76 {beats}/min
CSEPPHR: 98 {beats}/min
LVDIAVOL: 63 mL
LVSYSVOL: 16 mL
Nuc Stress EF: 75 %
SDS: 7
SRS: 1
SSS: 7
TID: 0.91

## 2015-01-25 MED ORDER — TECHNETIUM TC 99M SESTAMIBI GENERIC - CARDIOLITE
10.3000 | Freq: Once | INTRAVENOUS | Status: AC | PRN
  Administered 2015-01-25: 10 via INTRAVENOUS

## 2015-01-25 MED ORDER — REGADENOSON 0.4 MG/5ML IV SOLN
0.4000 mg | Freq: Once | INTRAVENOUS | Status: AC
  Administered 2015-01-25: 0.4 mg via INTRAVENOUS

## 2015-01-25 MED ORDER — TECHNETIUM TC 99M SESTAMIBI GENERIC - CARDIOLITE
29.8000 | Freq: Once | INTRAVENOUS | Status: AC | PRN
Start: 2015-01-25 — End: 2015-01-25
  Administered 2015-01-25: 29.8 via INTRAVENOUS

## 2015-02-02 ENCOUNTER — Encounter (HOSPITAL_COMMUNITY): Payer: PRIVATE HEALTH INSURANCE

## 2015-02-07 ENCOUNTER — Encounter: Payer: Self-pay | Admitting: Internal Medicine

## 2015-02-07 ENCOUNTER — Encounter: Payer: PRIVATE HEALTH INSURANCE | Admitting: Physician Assistant

## 2015-05-05 DIAGNOSIS — Z23 Encounter for immunization: Secondary | ICD-10-CM | POA: Diagnosis not present

## 2015-06-13 ENCOUNTER — Ambulatory Visit (INDEPENDENT_AMBULATORY_CARE_PROVIDER_SITE_OTHER): Payer: Medicare Other | Admitting: Family Medicine

## 2015-06-13 VITALS — BP 140/84 | HR 86 | Temp 98.5°F | Resp 16 | Ht 66.5 in | Wt 187.2 lb

## 2015-06-13 DIAGNOSIS — R300 Dysuria: Secondary | ICD-10-CM | POA: Diagnosis not present

## 2015-06-13 LAB — POCT URINALYSIS DIP (MANUAL ENTRY)
Bilirubin, UA: NEGATIVE
GLUCOSE UA: NEGATIVE
Ketones, POC UA: NEGATIVE
Nitrite, UA: POSITIVE — AB
SPEC GRAV UA: 1.02
Urobilinogen, UA: 0.2
pH, UA: 5

## 2015-06-13 LAB — POC MICROSCOPIC URINALYSIS (UMFC): MUCUS RE: ABSENT

## 2015-06-13 MED ORDER — DOXYCYCLINE HYCLATE 100 MG PO CAPS: 100.0000 mg | ORAL_CAPSULE | Freq: Two times a day (BID) | ORAL | Status: DC

## 2015-06-13 NOTE — Patient Instructions (Signed)

## 2015-06-13 NOTE — Progress Notes (Signed)
Subjective:    Patient ID: [name], female    DOB: 1941-06-23, 74 y.o.   MRN: [id number]  This chart was scribed for Reginia Forts, MD by Zola Button, Medical Scribe. This patient was seen in Room 8 and the patient's care was started at 7:56 PM.   06/13/2015  Urinary Frequency; Dysuria; Fatigue; and other   HPI  HPI Comments: Susan Bruce is a 74 y.o. female who presents to the [facility] complaining of cramping dysuria that started a week ago. Patient reports having associated cramping urinary frequency, urinary urgency, low-grade fever (98.4 F) and fatigue. She believes she has a UTI. She called her PCP this morning, who called in Cipro for her. She is concerned about the black label warning on the Cipro bottle and would like a different antibiotic. Patient notes that she has a torn peroneal tendon from working out several years ago and is concerned about Cipro side-effects. She denies nausea, vomiting, constipation, and loss of bladder control. She has allergies to penicillin (rash to head and back due to Zosyn). She states she does not get UTIs often.   Review of Systems  Constitutional: Positive for fever and fatigue. Negative for chills and diaphoresis.  Gastrointestinal: Negative for nausea, vomiting, abdominal pain and constipation.  Genitourinary: Positive for dysuria, urgency and frequency. Negative for hematuria, flank pain, decreased urine volume, vaginal bleeding, vaginal discharge, enuresis, vaginal pain and pelvic pain.    Past Medical History  Diagnosis Date  . Arthritis   . Cancer ([place])     Skin cancer- basal- leg   Past Surgical History  Procedure Laterality Date  . Appendectomy      open incision  . Hernia repair      incisional - after appendectomy  . Small intestine surgery      with appendectomy  . Knee arthroscopy w/ medial collateral ligament (mcl) repair Right   . Dilation and curettage of uterus     Allergies  Allergen Reactions   . Penicillins     REACTION: rash    Social History   Social History  . Marital Status: Married    Spouse Name: N/A  . Number of Children: N/A  . Years of Education: N/A   Occupational History  . Not on file.   Social History Main Topics  . Smoking status: Never Smoker   . Smokeless tobacco: Never Used  . Alcohol Use: 3.0 oz/week    5 Glasses of wine per week  . Drug Use: No  . Sexual Activity: Not on file     Comment: married   Other Topics Concern  . Not on file   Social History Narrative   Family History  Problem Relation Age of Onset  . Aortic stenosis Mother   . Atrial fibrillation Mother   . Neuropathy Mother   . Melanoma Father   . Cancer Paternal Grandmother        Objective:    BP 140/84 mmHg  Pulse 86  Temp(Src) 98.5 F (36.9 C) (Oral)  Resp 16  Ht 5' 6.5" (1.689 m)  Wt 187 lb 3.2 oz (84.913 kg)  BMI 29.77 kg/m2  SpO2 98% Physical Exam  Constitutional: She is oriented to person, place, and time. She appears well-developed and well-nourished. No distress.  HENT:  Head: Normocephalic and atraumatic.  Mouth/Throat: Oropharynx is clear and moist. No oropharyngeal exudate.  Eyes: Conjunctivae are normal. Pupils are equal, round, and reactive to light.  Neck:  Normal range of motion. Neck supple.  Cardiovascular: Normal rate, regular rhythm and normal heart sounds.  Exam reveals no gallop and no friction rub.   No murmur heard. Pulmonary/Chest: Effort normal and breath sounds normal. She has no wheezes. She has no rales.  Abdominal: Soft. Bowel sounds are normal. She exhibits no distension and no mass. There is no tenderness. There is no rebound, no guarding and no CVA tenderness.  Musculoskeletal: She exhibits no edema.  Neurological: She is alert and oriented to person, place, and time. No cranial nerve deficit.  Skin: Skin is warm and dry. No rash noted. She is not diaphoretic.  Psychiatric: She has a normal mood and affect. Her behavior is  normal.  Nursing note and vitals reviewed.       Assessment & Plan:   1. Dysuria    -New. -Rx for Doxycycline provided; pt desires to avoid Cipro if possible. -RTC for fever, vomiting, flank pain. -Increase fluid intake for the next week.   Orders Placed This Encounter  Procedures  . Urine culture  . POCT urinalysis dipstick  . POCT Microscopic Urinalysis ([facility])   Meds ordered this encounter  Medications  . Ciprofloxacin (CIPRO PO)    Sig: Take by mouth.  . doxycycline (VIBRAMYCIN) 100 MG capsule    Sig: Take 1 capsule (100 mg total) by mouth 2 (two) times daily.    Dispense:  14 capsule    Refill:  0    No Follow-up on file.   By signing my name below, I, Zola Button, attest that this documentation has been prepared under the direction and in the presence of Reginia Forts, MD.  Electronically Signed: Zola Button, Medical Scribe. 07/15/2015. 5:38 PM.   I personally performed the services described in this documentation, which was scribed in my presence. The recorded information has been reviewed and considered.  Seila Liston Elayne Guerin, M.D. [facility] [address] [place], [place]  [address] [phone] phone [phone] fax

## 2015-06-16 LAB — URINE CULTURE

## 2015-06-26 DIAGNOSIS — M170 Bilateral primary osteoarthritis of knee: Secondary | ICD-10-CM | POA: Diagnosis not present

## 2015-08-29 DIAGNOSIS — D2271 Melanocytic nevi of right lower limb, including hip: Secondary | ICD-10-CM | POA: Diagnosis not present

## 2015-08-29 DIAGNOSIS — D2272 Melanocytic nevi of left lower limb, including hip: Secondary | ICD-10-CM | POA: Diagnosis not present

## 2015-08-29 DIAGNOSIS — D2362 Other benign neoplasm of skin of left upper limb, including shoulder: Secondary | ICD-10-CM | POA: Diagnosis not present

## 2015-08-29 DIAGNOSIS — L720 Epidermal cyst: Secondary | ICD-10-CM | POA: Diagnosis not present

## 2015-08-29 DIAGNOSIS — D2261 Melanocytic nevi of right upper limb, including shoulder: Secondary | ICD-10-CM | POA: Diagnosis not present

## 2015-08-29 DIAGNOSIS — L821 Other seborrheic keratosis: Secondary | ICD-10-CM | POA: Diagnosis not present

## 2015-08-29 DIAGNOSIS — D2262 Melanocytic nevi of left upper limb, including shoulder: Secondary | ICD-10-CM | POA: Diagnosis not present

## 2015-08-29 DIAGNOSIS — D1801 Hemangioma of skin and subcutaneous tissue: Secondary | ICD-10-CM | POA: Diagnosis not present

## 2015-08-29 DIAGNOSIS — Z85828 Personal history of other malignant neoplasm of skin: Secondary | ICD-10-CM | POA: Diagnosis not present

## 2015-09-07 DIAGNOSIS — I447 Left bundle-branch block, unspecified: Secondary | ICD-10-CM | POA: Diagnosis not present

## 2015-09-07 DIAGNOSIS — Z6831 Body mass index (BMI) 31.0-31.9, adult: Secondary | ICD-10-CM | POA: Diagnosis not present

## 2015-09-07 DIAGNOSIS — R42 Dizziness and giddiness: Secondary | ICD-10-CM | POA: Diagnosis not present

## 2015-09-27 DIAGNOSIS — H43813 Vitreous degeneration, bilateral: Secondary | ICD-10-CM | POA: Diagnosis not present

## 2015-09-27 DIAGNOSIS — H25813 Combined forms of age-related cataract, bilateral: Secondary | ICD-10-CM | POA: Diagnosis not present

## 2015-09-27 DIAGNOSIS — H5203 Hypermetropia, bilateral: Secondary | ICD-10-CM | POA: Diagnosis not present

## 2015-09-27 DIAGNOSIS — H524 Presbyopia: Secondary | ICD-10-CM | POA: Diagnosis not present

## 2015-10-30 DIAGNOSIS — M170 Bilateral primary osteoarthritis of knee: Secondary | ICD-10-CM | POA: Diagnosis not present

## 2015-11-08 DIAGNOSIS — M1711 Unilateral primary osteoarthritis, right knee: Secondary | ICD-10-CM | POA: Diagnosis not present

## 2015-11-08 DIAGNOSIS — M170 Bilateral primary osteoarthritis of knee: Secondary | ICD-10-CM | POA: Diagnosis not present

## 2015-11-08 DIAGNOSIS — M1712 Unilateral primary osteoarthritis, left knee: Secondary | ICD-10-CM | POA: Diagnosis not present

## 2015-11-15 DIAGNOSIS — M170 Bilateral primary osteoarthritis of knee: Secondary | ICD-10-CM | POA: Diagnosis not present

## 2015-12-07 IMAGING — CR DG ABDOMEN ACUTE W/ 1V CHEST
3 series · 3 of 3 positions shown · non-contrast
Comparison: 01/28/2013

CLINICAL DATA: Fever, nausea, lower abdominal pain

EXAM:
DG ABDOMEN ACUTE W/ 1V CHEST

[PA]
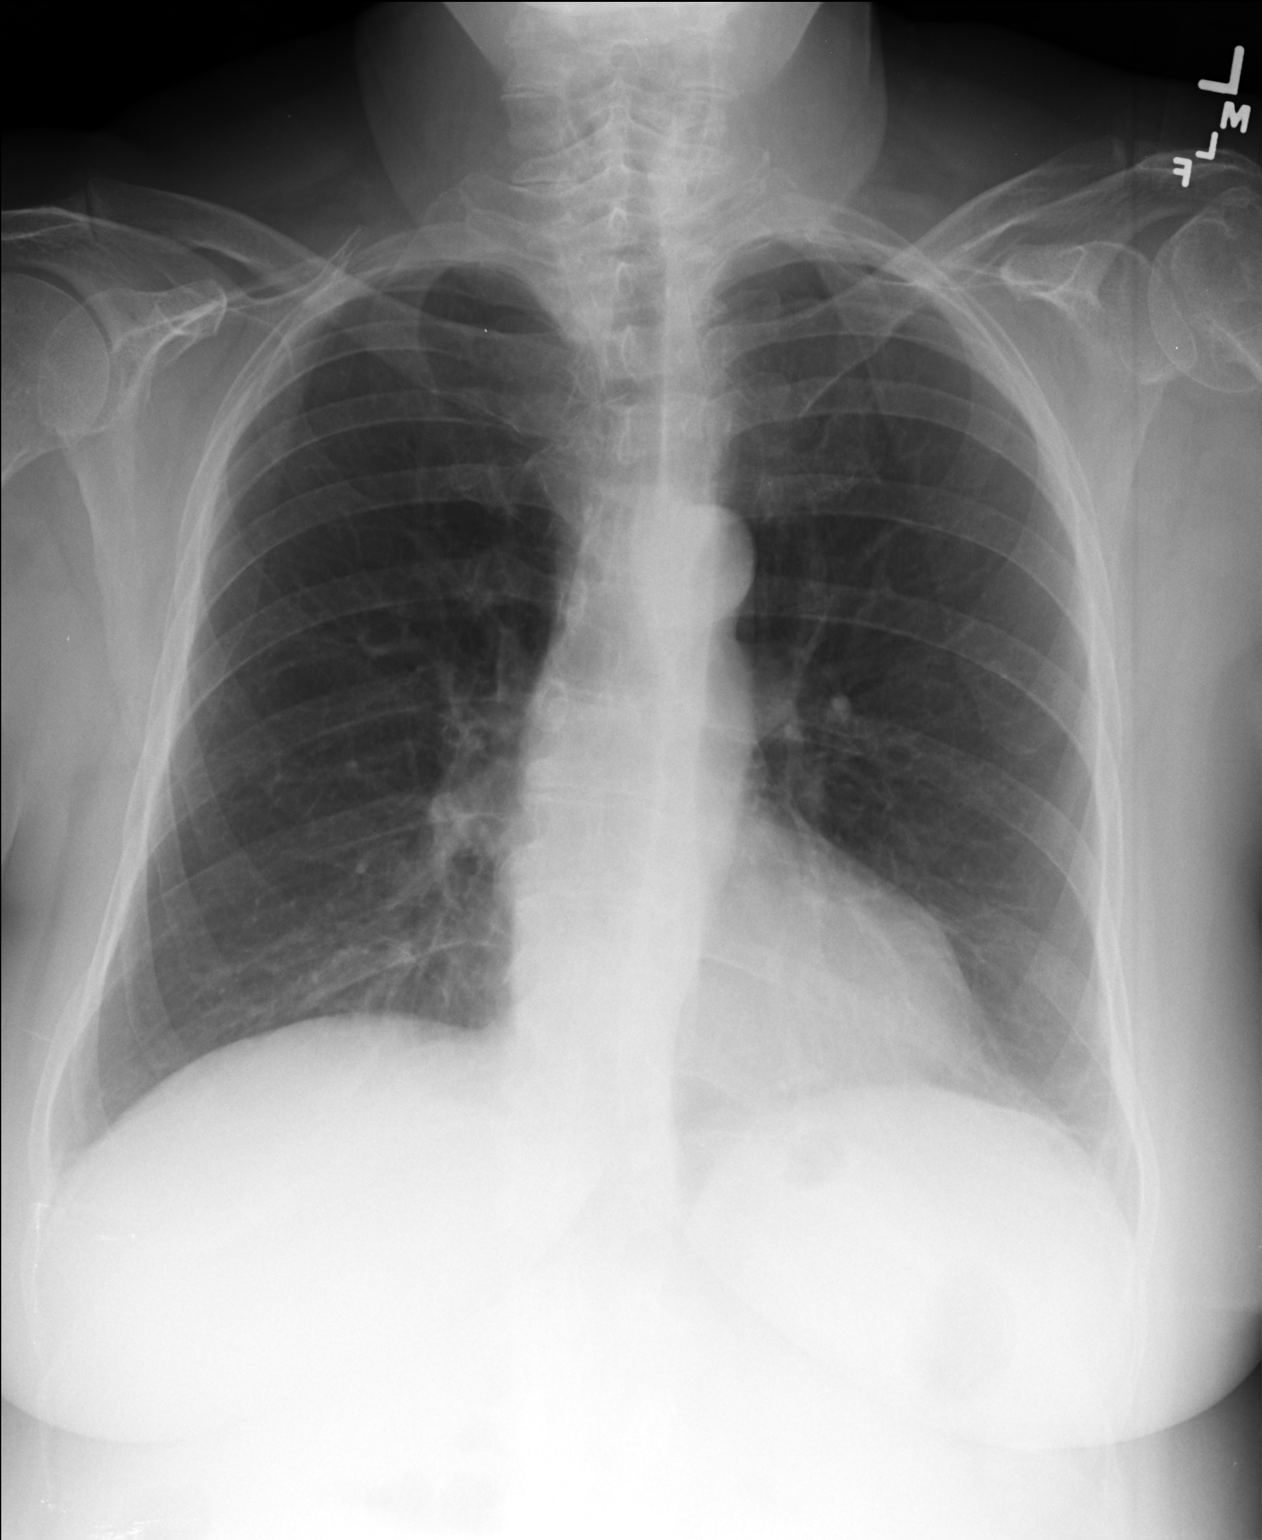

[AP (1 of 2)]
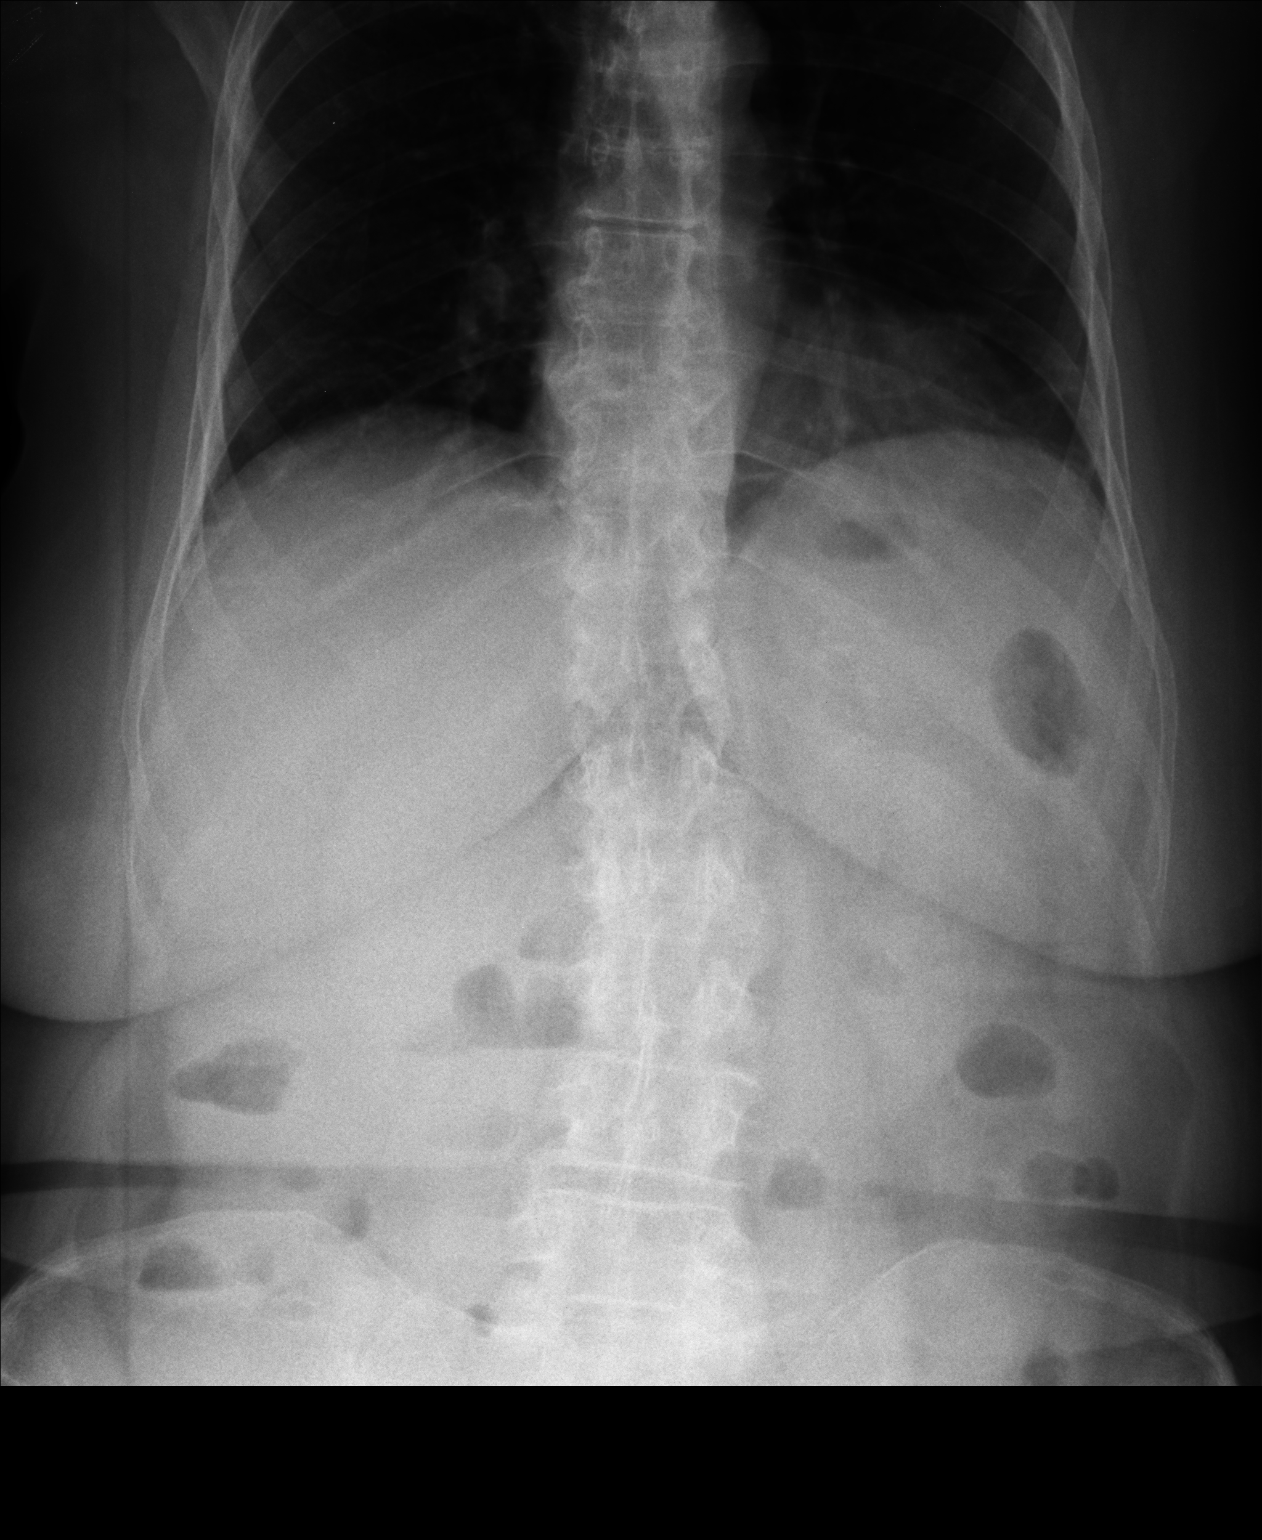

[AP (2 of 2)]
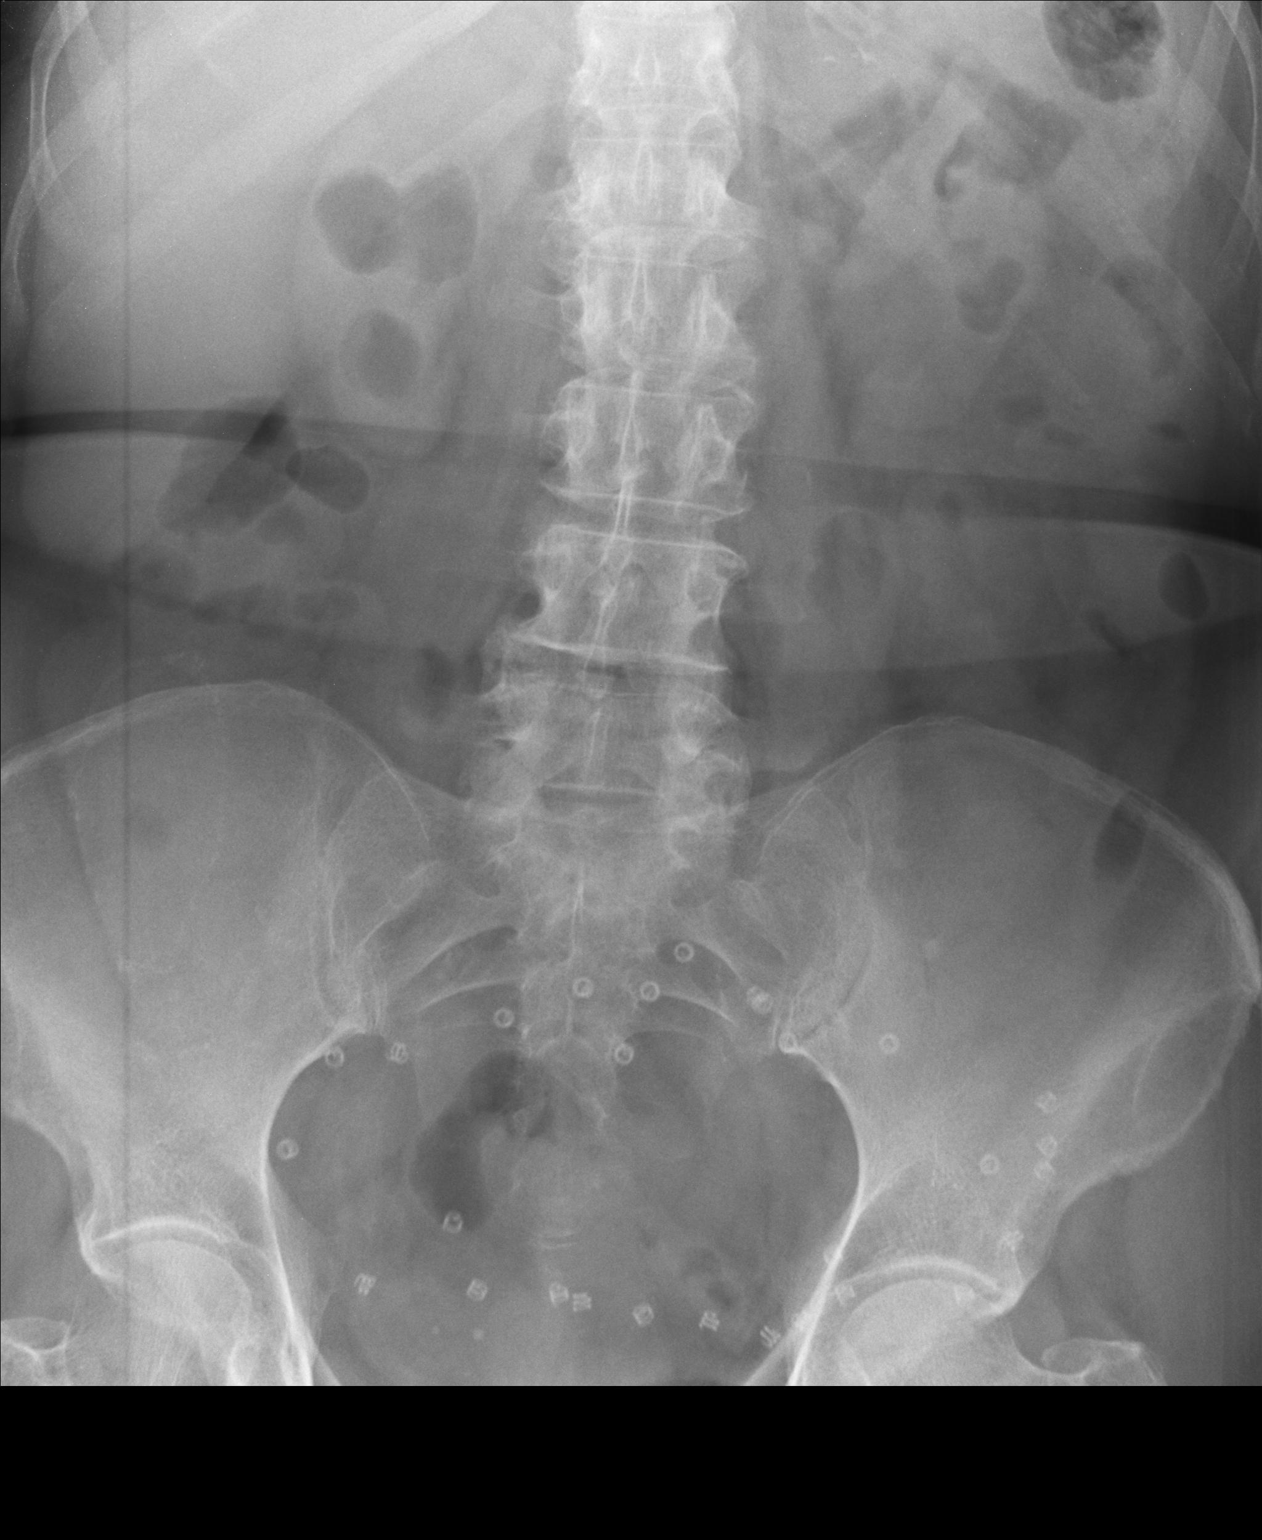

[3 of 3 positions shown; findings below may reference images not displayed]

FINDINGS: Cardiomediastinal silhouette is stable. No acute infiltrate or
pleural effusion. No pulmonary edema. There is normal small bowel
gas pattern. Post hernia repair changes noted lower abdominal wall.
No free abdominal air.
IMPRESSION: Negative abdominal radiographs.  No acute cardiopulmonary disease.

## 2015-12-17 DIAGNOSIS — M7672 Peroneal tendinitis, left leg: Secondary | ICD-10-CM | POA: Diagnosis not present

## 2015-12-17 DIAGNOSIS — M25475 Effusion, left foot: Secondary | ICD-10-CM | POA: Diagnosis not present

## 2015-12-20 DIAGNOSIS — N390 Urinary tract infection, site not specified: Secondary | ICD-10-CM | POA: Diagnosis not present

## 2015-12-20 DIAGNOSIS — E784 Other hyperlipidemia: Secondary | ICD-10-CM | POA: Diagnosis not present

## 2015-12-20 DIAGNOSIS — M859 Disorder of bone density and structure, unspecified: Secondary | ICD-10-CM | POA: Diagnosis not present

## 2015-12-20 DIAGNOSIS — R8290 Unspecified abnormal findings in urine: Secondary | ICD-10-CM | POA: Diagnosis not present

## 2015-12-28 DIAGNOSIS — E669 Obesity, unspecified: Secondary | ICD-10-CM | POA: Diagnosis not present

## 2015-12-28 DIAGNOSIS — I447 Left bundle-branch block, unspecified: Secondary | ICD-10-CM | POA: Diagnosis not present

## 2015-12-28 DIAGNOSIS — E784 Other hyperlipidemia: Secondary | ICD-10-CM | POA: Diagnosis not present

## 2015-12-28 DIAGNOSIS — G4700 Insomnia, unspecified: Secondary | ICD-10-CM | POA: Diagnosis not present

## 2015-12-28 DIAGNOSIS — Z6830 Body mass index (BMI) 30.0-30.9, adult: Secondary | ICD-10-CM | POA: Diagnosis not present

## 2015-12-28 DIAGNOSIS — Z1389 Encounter for screening for other disorder: Secondary | ICD-10-CM | POA: Diagnosis not present

## 2015-12-28 DIAGNOSIS — M859 Disorder of bone density and structure, unspecified: Secondary | ICD-10-CM | POA: Diagnosis not present

## 2015-12-28 DIAGNOSIS — Z0000 Encounter for general adult medical examination without abnormal findings: Secondary | ICD-10-CM | POA: Diagnosis not present

## 2015-12-28 DIAGNOSIS — M1990 Unspecified osteoarthritis, unspecified site: Secondary | ICD-10-CM | POA: Diagnosis not present

## 2016-01-02 DIAGNOSIS — M170 Bilateral primary osteoarthritis of knee: Secondary | ICD-10-CM | POA: Diagnosis not present

## 2016-02-13 DIAGNOSIS — M1712 Unilateral primary osteoarthritis, left knee: Secondary | ICD-10-CM | POA: Diagnosis not present

## 2016-02-27 DIAGNOSIS — M170 Bilateral primary osteoarthritis of knee: Secondary | ICD-10-CM | POA: Diagnosis not present

## 2016-02-27 DIAGNOSIS — M23362 Other meniscus derangements, other lateral meniscus, left knee: Secondary | ICD-10-CM | POA: Diagnosis not present

## 2016-02-27 DIAGNOSIS — M25332 Other instability, left wrist: Secondary | ICD-10-CM | POA: Diagnosis not present

## 2016-04-22 DIAGNOSIS — M1712 Unilateral primary osteoarthritis, left knee: Secondary | ICD-10-CM | POA: Diagnosis not present

## 2016-05-03 DIAGNOSIS — Z23 Encounter for immunization: Secondary | ICD-10-CM | POA: Diagnosis not present

## 2016-05-13 DIAGNOSIS — R1011 Right upper quadrant pain: Secondary | ICD-10-CM | POA: Diagnosis not present

## 2016-05-13 DIAGNOSIS — R197 Diarrhea, unspecified: Secondary | ICD-10-CM | POA: Diagnosis not present

## 2016-05-13 DIAGNOSIS — Z6830 Body mass index (BMI) 30.0-30.9, adult: Secondary | ICD-10-CM | POA: Diagnosis not present

## 2016-06-03 DIAGNOSIS — M1712 Unilateral primary osteoarthritis, left knee: Secondary | ICD-10-CM | POA: Diagnosis not present

## 2016-06-30 DIAGNOSIS — M1990 Unspecified osteoarthritis, unspecified site: Secondary | ICD-10-CM | POA: Diagnosis not present

## 2016-06-30 DIAGNOSIS — Z6831 Body mass index (BMI) 31.0-31.9, adult: Secondary | ICD-10-CM | POA: Diagnosis not present

## 2016-06-30 DIAGNOSIS — I447 Left bundle-branch block, unspecified: Secondary | ICD-10-CM | POA: Diagnosis not present

## 2016-06-30 DIAGNOSIS — G4700 Insomnia, unspecified: Secondary | ICD-10-CM | POA: Diagnosis not present

## 2016-06-30 DIAGNOSIS — M859 Disorder of bone density and structure, unspecified: Secondary | ICD-10-CM | POA: Diagnosis not present

## 2016-06-30 DIAGNOSIS — E669 Obesity, unspecified: Secondary | ICD-10-CM | POA: Diagnosis not present

## 2016-06-30 DIAGNOSIS — E784 Other hyperlipidemia: Secondary | ICD-10-CM | POA: Diagnosis not present

## 2016-07-02 DIAGNOSIS — H5203 Hypermetropia, bilateral: Secondary | ICD-10-CM | POA: Diagnosis not present

## 2016-07-02 DIAGNOSIS — H25813 Combined forms of age-related cataract, bilateral: Secondary | ICD-10-CM | POA: Diagnosis not present

## 2016-07-02 DIAGNOSIS — H353131 Nonexudative age-related macular degeneration, bilateral, early dry stage: Secondary | ICD-10-CM | POA: Diagnosis not present

## 2016-07-02 DIAGNOSIS — H43813 Vitreous degeneration, bilateral: Secondary | ICD-10-CM | POA: Diagnosis not present

## 2016-07-28 DIAGNOSIS — Z1212 Encounter for screening for malignant neoplasm of rectum: Secondary | ICD-10-CM | POA: Diagnosis not present

## 2016-08-28 DIAGNOSIS — L814 Other melanin hyperpigmentation: Secondary | ICD-10-CM | POA: Diagnosis not present

## 2016-08-28 DIAGNOSIS — L720 Epidermal cyst: Secondary | ICD-10-CM | POA: Diagnosis not present

## 2016-08-28 DIAGNOSIS — D2372 Other benign neoplasm of skin of left lower limb, including hip: Secondary | ICD-10-CM | POA: Diagnosis not present

## 2016-08-28 DIAGNOSIS — D2362 Other benign neoplasm of skin of left upper limb, including shoulder: Secondary | ICD-10-CM | POA: Diagnosis not present

## 2016-08-28 DIAGNOSIS — D2271 Melanocytic nevi of right lower limb, including hip: Secondary | ICD-10-CM | POA: Diagnosis not present

## 2016-08-28 DIAGNOSIS — L821 Other seborrheic keratosis: Secondary | ICD-10-CM | POA: Diagnosis not present

## 2016-08-28 DIAGNOSIS — D2272 Melanocytic nevi of left lower limb, including hip: Secondary | ICD-10-CM | POA: Diagnosis not present

## 2016-08-28 DIAGNOSIS — Z85828 Personal history of other malignant neoplasm of skin: Secondary | ICD-10-CM | POA: Diagnosis not present

## 2016-09-02 DIAGNOSIS — H25812 Combined forms of age-related cataract, left eye: Secondary | ICD-10-CM | POA: Diagnosis not present

## 2016-10-08 DIAGNOSIS — H353122 Nonexudative age-related macular degeneration, left eye, intermediate dry stage: Secondary | ICD-10-CM | POA: Diagnosis not present

## 2016-12-11 ENCOUNTER — Ambulatory Visit (INDEPENDENT_AMBULATORY_CARE_PROVIDER_SITE_OTHER): Payer: Medicare Other | Admitting: Emergency Medicine

## 2016-12-11 VITALS — BP 132/78 | HR 110 | Temp 100.9°F | Resp 18 | Ht 66.5 in | Wt 189.0 lb

## 2016-12-11 DIAGNOSIS — R070 Pain in throat: Secondary | ICD-10-CM

## 2016-12-11 DIAGNOSIS — J029 Acute pharyngitis, unspecified: Secondary | ICD-10-CM

## 2016-12-11 LAB — POCT RAPID STREP A (OFFICE): Rapid Strep A Screen: NEGATIVE

## 2016-12-11 MED ORDER — ACETAMINOPHEN 500 MG PO TABS
500.0000 mg | ORAL_TABLET | Freq: Once | ORAL | Status: AC
  Administered 2016-12-11: 500 mg via ORAL

## 2016-12-11 NOTE — Progress Notes (Signed)
Susan Bruce 76 y.o.   Chief Complaint  Patient presents with  . Sore Throat  . Headache  . Nasal Congestion  . Fever    HISTORY OF PRESENT ILLNESS: This is a 76 y.o. female complaining of sore throat since yesterday. Sore Throat   This is a new problem. The current episode started yesterday. The problem has been gradually worsening. The maximum temperature recorded prior to her arrival was 101 - 101.9 F. The pain is moderate. Associated symptoms include congestion, headaches, neck pain, swollen glands and trouble swallowing. Pertinent negatives include no abdominal pain, coughing, diarrhea, ear pain, shortness of breath or vomiting. She has had no exposure to strep or mono.     Prior to Admission medications   Not on File    Allergies  Allergen Reactions  . Penicillins     REACTION: rash    Patient Active Problem List   Diagnosis Date Noted  . LBBB (left bundle branch block) 01/12/2015  . Chest pain 01/11/2015  . JOINT EFFUSION, KNEE 08/07/2009  . KNEE PAIN, RIGHT 08/07/2009    Past Medical History:  Diagnosis Date  . Arthritis   . Cancer ([place])    Skin cancer- basal- leg    Past Surgical History:  Procedure Laterality Date  . APPENDECTOMY     open incision  . DILATION AND CURETTAGE OF UTERUS    . HERNIA REPAIR     incisional - after appendectomy  . KNEE ARTHROSCOPY W/ MEDIAL COLLATERAL LIGAMENT (MCL) REPAIR Right   . SMALL INTESTINE SURGERY     with appendectomy    Social History   Social History  . Marital status: Married    Spouse name: N/A  . Number of children: N/A  . Years of education: N/A   Occupational History  . Not on file.   Social History Main Topics  . Smoking status: Never Smoker  . Smokeless tobacco: Never Used  . Alcohol use 3.0 oz/week    5 Glasses of wine per week  . Drug use: No  . Sexual activity: Not on file     Comment: married   Other Topics Concern  . Not on file   Social History Narrative  . No narrative on file      Family History  Problem Relation Age of Onset  . Aortic stenosis Mother   . Atrial fibrillation Mother   . Neuropathy Mother   . Melanoma Father   . Cancer Paternal Grandmother      Review of Systems  Constitutional: Positive for fever. Negative for malaise/fatigue.  HENT: Positive for congestion, sore throat and trouble swallowing. Negative for ear pain, nosebleeds and sinus pain.   Eyes: Negative for blurred vision, double vision, discharge and redness.  Respiratory: Negative for cough and shortness of breath.   Cardiovascular: Negative for chest pain, palpitations and leg swelling.  Gastrointestinal: Negative for abdominal pain, diarrhea, nausea and vomiting.  Genitourinary: Negative for dysuria and hematuria.  Musculoskeletal: Positive for neck pain.  Skin: Negative for rash.  Neurological: Positive for headaches. Negative for dizziness, sensory change, focal weakness and weakness.  Endo/Heme/Allergies: Negative.   All other systems reviewed and are negative.  Vitals:   12/11/16 1540 12/11/16 1554  BP: (!) 142/85 132/78  Pulse: (!) 110   Resp: 18   Temp: (!) 100.9 F (38.3 C)      Physical Exam  Constitutional: She is oriented to person, place, and time. She appears well-developed and well-nourished.  HENT:  Head:  Normocephalic and atraumatic.  Nose: Nose normal.  Mouth/Throat: Uvula is midline. Posterior oropharyngeal erythema present. No oropharyngeal exudate.  Eyes: Conjunctivae and EOM are normal. Pupils are equal, round, and reactive to light.  Neck: Normal range of motion. Neck supple. No JVD present. No thyromegaly present.  Cardiovascular: Normal rate, regular rhythm and normal heart sounds.   Pulmonary/Chest: Effort normal and breath sounds normal.  Abdominal: Soft. Bowel sounds are normal. There is no tenderness.  Musculoskeletal: Normal range of motion.  Neurological: She is alert and oriented to person, place, and time. No sensory deficit. She  exhibits normal muscle tone.  Skin: Skin is warm and dry. Capillary refill takes less than 2 seconds.  Psychiatric: She has a normal mood and affect. Her behavior is normal.  Vitals reviewed.  Results for orders placed or performed in visit on 12/11/16 (from the past 24 hour(s))  POCT rapid strep A     Status: None   Collection Time: 12/11/16  4:46 PM  Result Value Ref Range   Rapid Strep A Screen Negative Negative     ASSESSMENT & PLAN: Susan Bruce was seen today for sore throat, headache, nasal congestion and fever.  Diagnoses and all orders for this visit:  Acute pharyngitis, unspecified etiology -     Culture, Group A Strep -     POCT rapid strep A -     acetaminophen (TYLENOL) tablet 500 mg; Take 1 tablet (500 mg total) by mouth once.  Pain in throat    Patient Instructions    Start taking Z-pak prescribed by your physician today.  IF you received an x-ray today, you will receive an invoice from [facility]. Please contact [facility] at [phone] with questions or concerns regarding your invoice.   IF you received labwork today, you will receive an invoice from [facility]. Please contact [facility] at [phone] with questions or concerns regarding your invoice.   Our billing staff will not be able to assist you with questions regarding bills from these companies.  You will be contacted with the lab results as soon as they are available. The fastest way to get your results is to activate your My Chart account. Instructions are located on the last page of this paperwork. If you have not heard from [place] regarding the results in 2 weeks, please contact this office.     Sore Throat When you have a sore throat, your throat may:  Hurt.  Burn.  Feel irritated.  Feel scratchy. Many things can cause a sore throat, including:  An infection.  Allergies.  Dryness in the air.  Smoke or pollution.  Gastroesophageal reflux disease (GERD).  A tumor. A  sore throat can be the first sign of another sickness. It can happen with other problems, like coughing or a fever. Most sore throats go away without treatment. Follow these instructions at home:  Take over-the-counter medicines only as told by your doctor.  Drink enough fluids to keep your pee (urine) clear or pale yellow.  Rest when you feel you need to.  To help with pain, try:  Sipping warm liquids, such as broth, herbal tea, or warm water.  Eating or drinking cold or frozen liquids, such as frozen ice pops.  Gargling with a salt-water mixture 3-4 times a day or as needed. To make a salt-water mixture, add -1 tsp of salt in 1 cup of warm water. Mix it until you cannot see the salt anymore.  Sucking on hard candy or throat lozenges.  Putting a cool-mist humidifier in your bedroom at night.  Sitting in the bathroom with the door closed for 5-10 minutes while you run hot water in the shower.  Do not use any tobacco products, such as cigarettes, chewing tobacco, and e-cigarettes. If you need help quitting, ask your doctor. Contact a doctor if:  You have a fever for more than 2-3 days.  You keep having symptoms for more than 2-3 days.  Your throat does not get better in 7 days.  You have a fever and your symptoms suddenly get worse. Get help right away if:  You have trouble breathing.  You cannot swallow fluids, soft foods, or your saliva.  You have swelling in your throat or neck that gets worse.  You keep feeling like you are going to throw up (vomit).  You keep throwing up. This information is not intended to replace advice given to you by your health care provider. Make sure you discuss any questions you have with your health care provider. Document Released: 05/13/2008 Document Revised: 03/30/2016 Document Reviewed: 05/25/2015 Elsevier Interactive Patient Education  2017 [employer] Inc.     Agustina Caroli, MD [facility]

## 2016-12-11 NOTE — Patient Instructions (Addendum)
  Start taking Z-pak prescribed by your physician today.  IF you received an x-ray today, you will receive an invoice from [facility]. Please contact [facility] at [phone] with questions or concerns regarding your invoice.   IF you received labwork today, you will receive an invoice from [facility]. Please contact [facility] at [phone] with questions or concerns regarding your invoice.   Our billing staff will not be able to assist you with questions regarding bills from these companies.  You will be contacted with the lab results as soon as they are available. The fastest way to get your results is to activate your My Chart account. Instructions are located on the last page of this paperwork. If you have not heard from [place] regarding the results in 2 weeks, please contact this office.     Sore Throat When you have a sore throat, your throat may:  Hurt.  Burn.  Feel irritated.  Feel scratchy. Many things can cause a sore throat, including:  An infection.  Allergies.  Dryness in the air.  Smoke or pollution.  Gastroesophageal reflux disease (GERD).  A tumor. A sore throat can be the first sign of another sickness. It can happen with other problems, like coughing or a fever. Most sore throats go away without treatment. Follow these instructions at home:  Take over-the-counter medicines only as told by your doctor.  Drink enough fluids to keep your pee (urine) clear or pale yellow.  Rest when you feel you need to.  To help with pain, try:  Sipping warm liquids, such as broth, herbal tea, or warm water.  Eating or drinking cold or frozen liquids, such as frozen ice pops.  Gargling with a salt-water mixture 3-4 times a day or as needed. To make a salt-water mixture, add -1 tsp of salt in 1 cup of warm water. Mix it until you cannot see the salt anymore.  Sucking on hard candy or throat lozenges.  Putting a cool-mist humidifier in your bedroom  at night.  Sitting in the bathroom with the door closed for 5-10 minutes while you run hot water in the shower.  Do not use any tobacco products, such as cigarettes, chewing tobacco, and e-cigarettes. If you need help quitting, ask your doctor. Contact a doctor if:  You have a fever for more than 2-3 days.  You keep having symptoms for more than 2-3 days.  Your throat does not get better in 7 days.  You have a fever and your symptoms suddenly get worse. Get help right away if:  You have trouble breathing.  You cannot swallow fluids, soft foods, or your saliva.  You have swelling in your throat or neck that gets worse.  You keep feeling like you are going to throw up (vomit).  You keep throwing up. This information is not intended to replace advice given to you by your health care provider. Make sure you discuss any questions you have with your health care provider. Document Released: 05/13/2008 Document Revised: 03/30/2016 Document Reviewed: 05/25/2015 Elsevier Interactive Patient Education  2017 [employer].

## 2016-12-14 LAB — CULTURE, GROUP A STREP: STREP A CULTURE: NEGATIVE

## 2016-12-15 ENCOUNTER — Encounter: Payer: Self-pay | Admitting: *Deleted

## 2016-12-31 DIAGNOSIS — N390 Urinary tract infection, site not specified: Secondary | ICD-10-CM | POA: Diagnosis not present

## 2016-12-31 DIAGNOSIS — R5383 Other fatigue: Secondary | ICD-10-CM | POA: Diagnosis not present

## 2016-12-31 DIAGNOSIS — R8290 Unspecified abnormal findings in urine: Secondary | ICD-10-CM | POA: Diagnosis not present

## 2016-12-31 DIAGNOSIS — M859 Disorder of bone density and structure, unspecified: Secondary | ICD-10-CM | POA: Diagnosis not present

## 2016-12-31 DIAGNOSIS — E784 Other hyperlipidemia: Secondary | ICD-10-CM | POA: Diagnosis not present

## 2017-01-07 DIAGNOSIS — Z6831 Body mass index (BMI) 31.0-31.9, adult: Secondary | ICD-10-CM | POA: Diagnosis not present

## 2017-01-07 DIAGNOSIS — Z1389 Encounter for screening for other disorder: Secondary | ICD-10-CM | POA: Diagnosis not present

## 2017-01-07 DIAGNOSIS — I447 Left bundle-branch block, unspecified: Secondary | ICD-10-CM | POA: Diagnosis not present

## 2017-01-07 DIAGNOSIS — M859 Disorder of bone density and structure, unspecified: Secondary | ICD-10-CM | POA: Diagnosis not present

## 2017-01-07 DIAGNOSIS — Z23 Encounter for immunization: Secondary | ICD-10-CM | POA: Diagnosis not present

## 2017-01-07 DIAGNOSIS — E668 Other obesity: Secondary | ICD-10-CM | POA: Diagnosis not present

## 2017-01-07 DIAGNOSIS — H268 Other specified cataract: Secondary | ICD-10-CM | POA: Diagnosis not present

## 2017-01-07 DIAGNOSIS — Z0000 Encounter for general adult medical examination without abnormal findings: Secondary | ICD-10-CM | POA: Diagnosis not present

## 2017-01-07 DIAGNOSIS — G4709 Other insomnia: Secondary | ICD-10-CM | POA: Diagnosis not present

## 2017-01-07 DIAGNOSIS — E784 Other hyperlipidemia: Secondary | ICD-10-CM | POA: Diagnosis not present

## 2017-01-08 DIAGNOSIS — Z1212 Encounter for screening for malignant neoplasm of rectum: Secondary | ICD-10-CM | POA: Diagnosis not present

## 2017-01-13 ENCOUNTER — Telehealth (HOSPITAL_COMMUNITY): Payer: Self-pay | Admitting: Internal Medicine

## 2017-01-13 ENCOUNTER — Encounter (HOSPITAL_COMMUNITY): Payer: PRIVATE HEALTH INSURANCE

## 2017-01-13 ENCOUNTER — Other Ambulatory Visit: Payer: Self-pay | Admitting: Internal Medicine

## 2017-01-13 DIAGNOSIS — I447 Left bundle-branch block, unspecified: Secondary | ICD-10-CM

## 2017-01-14 ENCOUNTER — Encounter (HOSPITAL_COMMUNITY): Payer: PRIVATE HEALTH INSURANCE

## 2017-01-14 ENCOUNTER — Other Ambulatory Visit (HOSPITAL_COMMUNITY): Payer: Self-pay | Admitting: Internal Medicine

## 2017-01-14 ENCOUNTER — Other Ambulatory Visit: Payer: Self-pay | Admitting: Internal Medicine

## 2017-01-14 DIAGNOSIS — I447 Left bundle-branch block, unspecified: Secondary | ICD-10-CM

## 2017-01-14 NOTE — Telephone Encounter (Signed)
  01/13/2017 01:48 PM Phone (Outgoing) Ashley-RN (Provider) [phone]   Left Message - Eustace Pen and left a message for her to CB so that I could get clarity on what type of Nuc stress test was needed.     By Verdene Rio

## 2017-01-15 ENCOUNTER — Telehealth (HOSPITAL_COMMUNITY): Payer: Self-pay | Admitting: *Deleted

## 2017-01-15 NOTE — Telephone Encounter (Signed)
Patient given detailed instructions per Myocardial Perfusion Study Information Sheet for the test on 01/16/17 at 11:00. Patient notified to arrive 15 minutes early and that it is imperative to arrive on time for appointment to keep from having the test rescheduled.  If you need to cancel or reschedule your appointment, please call the office within 24 hours of your appointment. . Patient verbalized understanding.Susan Bruce

## 2017-01-16 ENCOUNTER — Ambulatory Visit (HOSPITAL_COMMUNITY): Payer: Medicare Other | Attending: Cardiovascular Disease

## 2017-01-16 DIAGNOSIS — I447 Left bundle-branch block, unspecified: Secondary | ICD-10-CM | POA: Diagnosis not present

## 2017-01-16 LAB — MYOCARDIAL PERFUSION IMAGING
CHL CUP RESTING HR STRESS: 82 {beats}/min
LHR: 0.31
LV dias vol: 72 mL (ref 46–106)
LVSYSVOL: 25 mL
NUC STRESS TID: 1.04
Peak HR: 103 {beats}/min
SDS: 6
SRS: 9
SSS: 15

## 2017-01-16 MED ORDER — TECHNETIUM TC 99M TETROFOSMIN IV KIT
10.2000 | PACK | Freq: Once | INTRAVENOUS | Status: AC | PRN
  Administered 2017-01-16: 10.2 via INTRAVENOUS
  Filled 2017-01-16: qty 11

## 2017-01-16 MED ORDER — TECHNETIUM TC 99M TETROFOSMIN IV KIT
33.0000 | PACK | Freq: Once | INTRAVENOUS | Status: AC | PRN
  Administered 2017-01-16: 33 via INTRAVENOUS
  Filled 2017-01-16: qty 33

## 2017-01-16 MED ORDER — REGADENOSON 0.4 MG/5ML IV SOLN
0.4000 mg | Freq: Once | INTRAVENOUS | Status: AC
  Administered 2017-01-16: 0.4 mg via INTRAVENOUS

## 2017-01-27 DIAGNOSIS — M1712 Unilateral primary osteoarthritis, left knee: Secondary | ICD-10-CM | POA: Diagnosis not present

## 2017-01-27 DIAGNOSIS — M1711 Unilateral primary osteoarthritis, right knee: Secondary | ICD-10-CM | POA: Diagnosis not present

## 2017-02-19 ENCOUNTER — Ambulatory Visit (INDEPENDENT_AMBULATORY_CARE_PROVIDER_SITE_OTHER): Payer: Medicare Other | Admitting: Neurology

## 2017-02-19 ENCOUNTER — Encounter: Payer: Self-pay | Admitting: Neurology

## 2017-02-19 VITALS — BP 191/110 | HR 86 | Ht 66.5 in | Wt 186.0 lb

## 2017-02-19 DIAGNOSIS — R0683 Snoring: Secondary | ICD-10-CM | POA: Diagnosis not present

## 2017-02-19 DIAGNOSIS — M25472 Effusion, left ankle: Secondary | ICD-10-CM | POA: Diagnosis not present

## 2017-02-19 DIAGNOSIS — G8929 Other chronic pain: Secondary | ICD-10-CM

## 2017-02-19 DIAGNOSIS — G478 Other sleep disorders: Secondary | ICD-10-CM | POA: Diagnosis not present

## 2017-02-19 DIAGNOSIS — E663 Overweight: Secondary | ICD-10-CM

## 2017-02-19 DIAGNOSIS — R51 Headache: Secondary | ICD-10-CM | POA: Diagnosis not present

## 2017-02-19 DIAGNOSIS — M25471 Effusion, right ankle: Secondary | ICD-10-CM

## 2017-02-19 DIAGNOSIS — R400 Somnolence: Secondary | ICD-10-CM | POA: Diagnosis not present

## 2017-02-19 DIAGNOSIS — M25561 Pain in right knee: Secondary | ICD-10-CM

## 2017-02-19 DIAGNOSIS — R519 Headache, unspecified: Secondary | ICD-10-CM

## 2017-02-19 DIAGNOSIS — R351 Nocturia: Secondary | ICD-10-CM | POA: Diagnosis not present

## 2017-02-19 DIAGNOSIS — M25562 Pain in left knee: Secondary | ICD-10-CM | POA: Diagnosis not present

## 2017-02-19 NOTE — Patient Instructions (Signed)

## 2017-02-19 NOTE — Progress Notes (Signed)
Subjective:    Patient ID: Susan Bruce is a 76 y.o. female.  HPI     Star Age, MD, PhD [facility] [address], [address] [place], [place] [address]  Dear Dr. Reynaldo Minium,   I saw your patient, Susan Bruce, upon your kind request in my neurologic clinic today for initial consultation of her sleep disorder. The patient is unaccompanied today. As you know, [name] is a 76 year old right-handed woman with an underlying medical history of arthritis, skin cancer, left bundle branch block, hyperlipidemia, and obesity, who reports a longstanding history of sleep difficulties, including snoring and difficulty with sleep maintenance.  I reviewed your office note from 01/07/2017, which you kindly included.  She has rare morning headaches, and while she falls asleep quickly, she rarely ever stays asleep.  She does not watch TV in bed, goes to bed around 10:30, after the news, wakes up around 7:30, husband sets an alarm.  He likes to read a little longer, and therefore she uses a sleep mask, as he has his light on. She has nocturia once per night.  She has tried OTC sleep aid, from time to time, not helpful, including melatonin. Tried Ambien one time during her hospitalization in 2004 when she had emergent AE, but it did not help at the time, but she was also quite sick at the time, had partial colectomy secondary to peritonitis, secondary to ruptured appendix. She had incisional hernia surgery in 2005. She has had knee pain bilaterally for years. She sees Dr. Veverly Fells for this. She has had injections including cartilage injections as well as steroid injections. She is eventually looking at knee replacement surgery bilaterally as I understand. She denies telltale symptoms of restless leg syndrome. Her son had a sleep study recently and was told he had mild OSA. She has a 30 year old son and a 47 year old son. She is a nonsmoker, she drinks alcohol occasionally, 2-4 glasses per  week on average, caffeine in the form of coffee, 2 months in the mornings, typically no soda or tea. She does not nap well. She has difficulty napping even if she tries. Epworth Sleepiness Scale score is 5 out of 24, fatigue score is 45 out of 63.  Her Past Medical History Is Significant For: Past Medical History:  Diagnosis Date  . Arthritis   . Cancer ([place])    Skin cancer- basal- leg    Her Past Surgical History Is Significant For: Past Surgical History:  Procedure Laterality Date  . APPENDECTOMY     open incision  . DILATION AND CURETTAGE OF UTERUS    . HERNIA REPAIR     incisional - after appendectomy  . KNEE ARTHROSCOPY W/ MEDIAL COLLATERAL LIGAMENT (MCL) REPAIR Right   . SMALL INTESTINE SURGERY     with appendectomy    Her Family History Is Significant For: Family History  Problem Relation Age of Onset  . Aortic stenosis Mother   . Atrial fibrillation Mother   . Neuropathy Mother   . Melanoma Father   . Cancer Maternal Grandmother   . Cancer Paternal Grandmother     Her Social History Is Significant For: Social History   Social History  . Marital status: Married    Spouse name: N/A  . Number of children: N/A  . Years of education: N/A   Social History Main Topics  . Smoking status: Never Smoker  . Smokeless tobacco: Never Used  . Alcohol use 3.0 oz/week    5  Glasses of wine per week  . Drug use: No  . Sexual activity: Not Asked     Comment: married   Other Topics Concern  . None   Social History Narrative  . None    Her Allergies Are:  Allergies  Allergen Reactions  . Penicillins     REACTION: rash  :   Her Current Medications Are:  Outpatient Encounter Prescriptions as of 02/19/2017  Medication Sig  . Cholecalciferol (VITAMIN D3) 1000 units CAPS Take 1 capsule by mouth 2 (two) times daily.  . diclofenac sodium (VOLTAREN) 1 % GEL Apply topically 4 (four) times daily.  . Glucosamine HCl (GLUCOSAMINE PO) Take by mouth.  . Magnesium 250 MG TABS  Take by mouth daily.  . Multiple Vitamins-Minerals (CENTRUM SILVER 50+WOMEN PO) Take 1 tablet by mouth daily.  . Multiple Vitamins-Minerals (PRESERVISION AREDS PO) Take by mouth.  . Omega-3 Fatty Acids (FISH OIL PO) Take by mouth.  . vitamin B-12 (CYANOCOBALAMIN) 100 MCG tablet Take 100 mcg by mouth 2 (two) times daily.   No facility-administered encounter medications on file as of 02/19/2017.   :  Review of Systems:  Out of a complete 14 point review of systems, all are reviewed and negative with the exception of these symptoms as listed below:  Review of Systems  Neurological:       Pt presents today to discuss her sleep. Pt reports not sleeping well at night. Pt has never had a sleep study but does endorse snoring.  Epworth Sleepiness Scale 0= would never doze 1= slight chance of dozing 2= moderate chance of dozing 3= high chance of dozing  Sitting and reading: 1 Watching TV: 3 Sitting inactive in a public place (ex. Theater or meeting): 0 As a passenger in a car for an hour without a break: 0 Lying down to rest in the afternoon: 0 Sitting and talking to someone: 0 Sitting quietly after lunch (no alcohol): 1 In a car, while stopped in traffic: 0 Total: 5    Objective:  Neurological Exam  Physical Exam Physical Examination:   Vitals:   02/19/17 1519  BP: (!) 191/110  Pulse: 86    General Examination: The patient is a very pleasant 76 y.o. female in no acute distress. She appears well-developed and well-nourished and well groomed.   HEENT: Normocephalic, atraumatic, pupils are equal, round and reactive to light and accommodation. Funduscopic exam is normal with sharp disc margins noted. Extraocular tracking is good without limitation to gaze excursion or nystagmus noted. Normal smooth pursuit is noted. Hearing is grossly intact. Face is symmetric with normal facial animation and normal facial sensation. Speech is clear with no dysarthria noted. There is no hypophonia.  There is no lip, neck/head, jaw or voice tremor. Neck is supple with full range of passive and active motion. There are no carotid bruits on auscultation. Oropharynx exam reveals: mild mouth dryness, adequate dental hygiene and mild to moderate airway crowding, due to redundant soft palate and smaller airway opening. Mallampati is class II. Tongue protrudes centrally and palate elevates symmetrically. Tonsils are small/absent? Never had TE. Neck size is 15 inches. She has a Mild overbite.    Chest: Clear to auscultation without wheezing, rhonchi or crackles noted.  Heart: S1+S2+0, regular and normal without murmurs, rubs or gallops noted.   Abdomen: Soft, non-tender and non-distended with normal bowel sounds appreciated on auscultation.  Extremities: There is 1+ pitting edema in the distal lower extremities bilaterally, L more than R. Pedal pulses  are intact.  Skin: Warm and dry without trophic changes noted. There are no varicose veins.  Musculoskeletal: exam reveals no obvious joint deformities, tenderness or joint swelling or erythema, with the exception of left shoulder higher than right, she has a history of left humerus fracture and has decreased range of motion in her left shoulder joint.  Neurologically:  Mental status: The patient is awake, alert and oriented in all 4 spheres. Her immediate and remote memory, attention, language skills and fund of knowledge are appropriate. There is no evidence of aphasia, agnosia, apraxia or anomia. Speech is clear with normal prosody and enunciation. Thought process is linear. Mood is normal and affect is normal.  Cranial nerves II - XII are as described above under HEENT exam. Motor exam: Normal bulk, strength and tone is noted. There is no drift, tremor or rebound. Romberg is negative, except for mild sway. Reflexes are 1+ throughout. Babinski: Toes are flexor bilaterally. Fine motor skills and coordination: intact with normal finger taps, normal hand  movements, normal rapid alternating patting, normal foot taps and normal foot agility.  Cerebellar testing: No dysmetria or intention tremor on finger to nose testing. Heel to shin is not tested d/t knee pain. Sensory exam: intact to light touch in the upper and lower extremities.  Gait, station and balance: She stands with difficulty. No veering to one side is noted. No leaning to one side is noted. Posture is age-appropriate and stance is narrow based. Gait shows slightly slow and cautious gait, she has mild difficulty with tandem walk.  Assessment and Plan:   In summary, MARGREAT WIDENER is a very pleasant 76 y.o.-year old female with an underlying medical history of arthritis, skin cancer, left bundle branch block, hyperlipidemia, and obesity, whose history and physical exam are concerning for obstructive sleep apnea (OSA). I had a long chat with the patient about my findings and the diagnosis of OSA, its prognosis and treatment options. We talked about medical treatments, surgical interventions and non-pharmacological approaches. I explained in particular the risks and ramifications of untreated moderate to severe OSA, especially with respect to developing cardiovascular disease down the Road, including congestive heart failure, difficult to treat hypertension, cardiac arrhythmias, or stroke. Even type 2 diabetes has, in part, been linked to untreated OSA. Symptoms of untreated OSA include daytime sleepiness, memory problems, mood irritability and mood disorder such as depression and anxiety, lack of energy, as well as recurrent headaches, especially morning headaches. We talked about trying to maintain a healthy lifestyle in general, as well as the importance of weight control. I encouraged the patient to eat healthy, exercise daily and keep well hydrated, to keep a scheduled bedtime and wake time routine, to not skip any meals and eat healthy snacks in between meals. I advised the patient not to drive when  feeling sleepy. I recommended the following at this time: sleep study with potential positive airway pressure titration. (We will score hypopneas at 4%).   I explained the sleep test procedure to the patient and also outlined possible surgical and non-surgical treatment options of OSA, including the use of a custom-made dental device (which would require a referral to a specialist dentist or oral surgeon), upper airway surgical options, such as pillar implants, radiofrequency surgery, tongue base surgery, and UPPP (which would involve a referral to an ENT surgeon). Rarely, jaw surgery such as mandibular advancement may be considered.  I also explained the CPAP treatment option to the patient, who indicated that she would be  willing to try CPAP if the need arises. I explained the importance of being compliant with PAP treatment, not only for insurance purposes but primarily to improve Her symptoms, and for the patient's long term health benefit, including to reduce Her cardiovascular risks. I answered all her questions today and the patient was in agreement. I would like to see her back after the sleep study is completed and encouraged her to call with any interim questions, concerns, problems or updates.   Thank you very much for allowing me to participate in the care of this nice patient. If I can be of any further assistance to you please do not hesitate to call me at [phone].  Sincerely,   Star Age, MD, PhD

## 2017-03-01 ENCOUNTER — Ambulatory Visit (INDEPENDENT_AMBULATORY_CARE_PROVIDER_SITE_OTHER): Payer: Medicare Other | Admitting: Neurology

## 2017-03-01 DIAGNOSIS — R9431 Abnormal electrocardiogram [ECG] [EKG]: Secondary | ICD-10-CM

## 2017-03-01 DIAGNOSIS — G4720 Circadian rhythm sleep disorder, unspecified type: Secondary | ICD-10-CM

## 2017-03-01 DIAGNOSIS — G4733 Obstructive sleep apnea (adult) (pediatric): Secondary | ICD-10-CM | POA: Diagnosis not present

## 2017-03-04 NOTE — Progress Notes (Signed)
Patient referred by Dr. Reynaldo Minium, seen by me on 02/19/17, diagnostic PSG on 03/01/17.    Please call and notify the patient that the recent sleep study did confirm the diagnosis of obstructive sleep apnea and that I recommend treatment for this in the form of CPAP. This will require a repeat sleep study for proper titration and mask fitting. Please explain to patient and arrange for a CPAP titration study. I have placed an order in the chart. Thanks, and please route to [facility] for scheduling next sleep study.  Susan Age, MD, PhD [facility])

## 2017-03-04 NOTE — Addendum Note (Signed)
Addended by: Star Age on: 03/04/2017 06:45 PM   Modules accepted: Orders

## 2017-03-04 NOTE — Procedures (Signed)
PATIENT'S NAME:  Susan Bruce, Susan Bruce DOB:      1940-09-10      MR#:    [id number]     DATE OF RECORDING: 03/01/2017 REFERRING M.D.:  Burnard Bunting, MD Study Performed:   Baseline Polysomnogram HISTORY: 76 year old woman with a history of arthritis, skin cancer, left bundle branch block, hyperlipidemia, and obesity, who reports a longstanding history of sleep difficulties, including snoring and difficulty with sleep maintenance, morning headaches. The patient endorsed the Epworth Sleepiness Scale at 5/24 points. The patient's weight 186 pounds with a height of 66 (inches), resulting in a BMI of 29.4 kg/m2. The patient's neck circumference measured 15 inches.  CURRENT MEDICATIONS: Cholecalciferol, Diclofenac, Glucosamine, Magnesium, Centrum Silver, Omega 2 Fatty Acids, B-12,   PROCEDURE:  This is a multichannel digital polysomnogram utilizing the Somnostar 11.2 system.  Electrodes and sensors were applied and monitored per AASM Specifications.   EEG, EOG, Chin and Limb EMG, were sampled at 200 Hz.  ECG, Snore and Nasal Pressure, Thermal Airflow, Respiratory Effort, CPAP Flow and Pressure, Oximetry was sampled at 50 Hz. Digital video and audio were recorded.      BASELINE STUDY  Lights Out was at 22:12 and Lights On at 05:00.  Total recording time (TRT) was 409 minutes, with a total sleep time (TST) of  294.5 minutes.   The patient's sleep latency was 41.5 minutes, which is delayed. REM latency was 235.5 minutes, which is markedly delayed. The sleep efficiency was 72%, which is reduced.     SLEEP ARCHITECTURE: WASO (Wake after sleep onset) was 97.5 minutes with moderate sleep fragmentation noted.  There were 16 minutes in Stage N1, 241 minutes Stage N2, 1.5 minutes Stage N3 and 36 minutes in Stage REM.  The percentage of Stage N1 was 5.4%, Stage N2 was 81.8%, which is markedly increased, Stage N3 was .5%, which is near absent, and Stage R (REM sleep) was 12.2%, which is reduced. The arousals were noted as: 44  were spontaneous, 0 were associated with PLMs, 22 were associated with respiratory events.    Audio and video analysis did not show any abnormal or unusual movements, behaviors, phonations or vocalizations.  The patient took 1 bathroom break. Snoring was noted, mostly moderate to loud. The EKG was noted to have some irregularities, including PVCs noted.   RESPIRATORY ANALYSIS:  There were a total of 33 respiratory events:  3 obstructive apneas, 0 central apneas and 0 mixed apneas with a total of 3 apneas and an apnea index (AI) of .6 /hour. There were 30 hypopneas with a hypopnea index of 6.1 /hour. The patient also had 12 respiratory event related arousals (RERAs).      The total APNEA/HYPOPNEA INDEX (AHI) was 6.7/hour and the total RESPIRATORY DISTURBANCE INDEX was 9.2 /hour.  12 events occurred in REM sleep and 36 events in NREM. The REM AHI was 20 /hour, versus a non-REM AHI of 4.9. The patient spent 274.5 minutes of total sleep time in the supine position and 20 minutes in non-supine.. The supine AHI was 7.3 versus a non-supine AHI of 0.0.  OXYGEN SATURATION & C02:  The Wake baseline 02 saturation was 96%, with the lowest being 85%. Time spent below 89% saturation equaled 11 minutes.  PERIODIC LIMB MOVEMENTS: The patient had a total of 0 Periodic Limb Movements.  The Periodic Limb Movement (PLM) index was 0 and the PLM Arousal index was 0/hour.  Post-study, the patient indicated that sleep was the same as usual.   IMPRESSION: 1.  Obstructive Sleep Apnea (OSA) 2. Abnormal EKG 3. Dysfunctions associated with sleep stages or arousal from sleep  RECOMMENDATIONS: 1. This study demonstrates overall mild obstructive sleep apnea, moderate in REM sleep with a total AHI of 6.7/hour, REM AHI of 20/hour, supine AHI of 7.3/hour and O2 nadir of 85%. Given the patient's medical history and sleep related complaints, a full-night CPAP titration study is recommended to optimize therapy. Other treatment options  may include avoidance of supine sleep position along with weight loss, upper airway or jaw surgery in selected patients or the use of an oral appliance in certain patients. ENT evaluation and/or consultation with a maxillofacial surgeon or dentist may be feasible in some instances. 2. Please note that untreated obstructive sleep apnea carries additional perioperative morbidity. Patients with significant obstructive sleep apnea should receive perioperative PAP therapy and the surgeons and particularly the anesthesiologist should be informed of the diagnosis and the severity of the sleep disordered breathing. 3. The EKG was noted to have some irregular appearance, including PVCs on single lead EKG; clinical correlation is recommended and consultation with cardiology may be feasible.  4. This study shows sleep fragmentation and abnormal sleep stage percentages; these are nonspecific findings and per se do not signify an intrinsic sleep disorder or a cause for the patient's sleep-related symptoms. Causes include (but are not limited to) the first night effect of the sleep study, circadian rhythm disturbances, medication effect or an underlying mood disorder or medical problem.  5. The patient should be cautioned not to drive, work at heights, or operate dangerous or heavy equipment when tired or sleepy. Review and reiteration of good sleep hygiene measures should be pursued with any patient. 6. The patient will be seen in follow-up by Dr. Rexene Alberts at [facility] for discussion of the test results and further management strategies. The referring provider will be notified of the test results.  I certify that I have reviewed the entire raw data recording prior to the issuance of this report in accordance with the Standards of Accreditation of the American Academy of Sleep Medicine (AASM)  Star Age, MD, PhD Diplomat, American Board of Psychiatry and Neurology (Neurology and Sleep Medicine)

## 2017-03-05 ENCOUNTER — Telehealth: Payer: Self-pay

## 2017-03-05 NOTE — Telephone Encounter (Signed)
I called pt. I advised pt that Dr. Rexene Alberts reviewed their sleep study results and found that pt does have osa and Dr. Rexene Alberts recommends treatment for her osa in the form of a cpap. Dr. Rexene Alberts recommends that pt return for a repeat sleep study in order to properly titrate the cpap and ensure a good mask fit. I advised pt that our sleep lab will file with pt's insurance and call pt to schedule the sleep study when we hear back from the pt's insurance regarding coverage of this sleep study. Pt says that she wants to think about doing the cpap titration study and call us back with her decision. Pt verbalized understanding of results and will call us back to let us know of her decision.

## 2017-03-05 NOTE — Telephone Encounter (Signed)
-----   Message from Star Age, MD sent at 03/04/2017  6:45 PM EDT ----- Patient referred by Dr. Reynaldo Minium, seen by me on 02/19/17, diagnostic PSG on 03/01/17.    Please call and notify the patient that the recent sleep study did confirm the diagnosis of obstructive sleep apnea and that I recommend treatment for this in the form of CPAP. This will require a repeat sleep study for proper titration and mask fitting. Please explain to patient and arrange for a CPAP titration study. I have placed an order in the chart. Thanks, and please route to [facility] for scheduling next sleep study.  Star Age, MD, PhD [facility])

## 2017-03-12 ENCOUNTER — Encounter: Payer: Self-pay | Admitting: Neurology

## 2017-03-12 ENCOUNTER — Ambulatory Visit (INDEPENDENT_AMBULATORY_CARE_PROVIDER_SITE_OTHER): Payer: Medicare Other | Admitting: Neurology

## 2017-03-12 VITALS — BP 176/92 | HR 102 | Ht 66.5 in | Wt 187.0 lb

## 2017-03-12 DIAGNOSIS — G4733 Obstructive sleep apnea (adult) (pediatric): Secondary | ICD-10-CM | POA: Diagnosis not present

## 2017-03-12 NOTE — Patient Instructions (Signed)
Your recent sleep study showed mild obstructive sleep apnea. Due to your symptoms, you can consider treatment with a CPAP vs. A dental appliance. Vs. Weight loss and sleeping off your back. Please think about these options. You can call us back if you would like a referral to Dr. Augustina Mood.  I will see you back as needed.

## 2017-03-12 NOTE — Progress Notes (Signed)
Subjective:    Patient ID: Susan Bruce is a 76 y.o. female.  HPI     Interim history:   Susan Bruce is a 76 year old right-handed woman with an underlying medical history of arthritis, skin cancer, left bundle branch block, hyperlipidemia, and obesity, who presents for follow-up consultation of her sleep disorder, after recent baseline sleep study testing. The patient is unaccompanied today. I first met her on 02/19/2017 at the request of her primary care physician, at which time she reported a long-standing history of sleep difficulties including snoring and morning headaches as well as difficulty with sleep maintenance. I invited her for a sleep study. She had a baseline sleep study on 03/01/2017. I went over her test results with her in detail today. Sleep latency was delayed at 41.5 minutes, REM latency was markedly delayed at 235.5 minutes. Sleep efficiency was reduced at 72%. She had an increased amount of wake after sleep onset at 97.5 minutes with moderate sleep fragmentation noted. She had a markedly increased percentage of stage II sleep, near absence of slow-wave sleep and a decreased percentage of REM sleep. She had snoring which range from moderate to loud mostly. She did have some PVCs on EKG. She had one bathroom break. Total AHI was 6.7 per hour, REM AHI was 20 per hour, supine AHI was 7.3 per hour, average oxygen saturation 96%, nadir was 85%, time below 89% saturation was 11 minutes for the night. She had no significant PLMS. Based on her test results and her sleep-related complaints, I suggested she return for a full night CPAP titration study. She requested a follow-up appointment to discuss.  Today, 03/12/2017: She reports doing okay, no new symptoms, no recent medication changes. Does worry about having sleep apnea and treatment options, she would like to avoid using CPAP. She would like to learn more about alternatives. We talked about treatment options, we talked about the details of  her sleep apnea and I also showed her the hip program for better visualization. She had multiple questions which I answered.  The patient's allergies, current medications, family history, past medical history, past social history, past surgical history and problem list were reviewed and updated as appropriate.   Previously (copied from previous notes for reference):   02/19/2017: (She) reports a longstanding history of sleep difficulties, including snoring and difficulty with sleep maintenance.  I reviewed your office note from 01/07/2017, which you kindly included.  She has rare morning headaches, and while she falls asleep quickly, she rarely ever stays asleep.  She does not watch TV in bed, goes to bed around 10:30, after the news, wakes up around 7:30, husband sets an alarm.  He likes to read a little longer, and therefore she uses a sleep mask, as he has his light on. She has nocturia once per night.  She has tried OTC sleep aid, from time to time, not helpful, including melatonin. Tried Ambien one time during her hospitalization in 2004 when she had emergent AE, but it did not help at the time, but she was also quite sick at the time, had partial colectomy secondary to peritonitis, secondary to ruptured appendix. She had incisional hernia surgery in 2005. She has had knee pain bilaterally for years. She sees Dr. Veverly Fells for this. She has had injections including cartilage injections as well as steroid injections. She is eventually looking at knee replacement surgery bilaterally as I understand. She denies telltale symptoms of restless leg syndrome. Her son had a sleep study recently  and was told he had mild OSA. She has a 74 year old son and a 55 year old son. She is a nonsmoker, she drinks alcohol occasionally, 2-4 glasses per week on average, caffeine in the form of coffee, 2 months in the mornings, typically no soda or tea. She does not nap well. She has difficulty napping even if she tries.  Epworth Sleepiness Scale score is 5 out of 24, fatigue score is 45 out of 63.  Her Past Medical History Is Significant For: Past Medical History:  Diagnosis Date  . Arthritis   . Cancer ([place])    Skin cancer- basal- leg    Her Past Surgical History Is Significant For: Past Surgical History:  Procedure Laterality Date  . APPENDECTOMY     open incision  . DILATION AND CURETTAGE OF UTERUS    . HERNIA REPAIR     incisional - after appendectomy  . KNEE ARTHROSCOPY W/ MEDIAL COLLATERAL LIGAMENT (MCL) REPAIR Right   . SMALL INTESTINE SURGERY     with appendectomy    Her Family History Is Significant For: Family History  Problem Relation Age of Onset  . Aortic stenosis Mother   . Atrial fibrillation Mother   . Neuropathy Mother   . Melanoma Father   . Cancer Maternal Grandmother   . Cancer Paternal Grandmother     Her Social History Is Significant For: Social History   Social History  . Marital status: Married    Spouse name: N/A  . Number of children: N/A  . Years of education: N/A   Social History Main Topics  . Smoking status: Never Smoker  . Smokeless tobacco: Never Used  . Alcohol use 3.0 oz/week    5 Glasses of wine per week  . Drug use: No  . Sexual activity: Not Asked     Comment: married   Other Topics Concern  . None   Social History Narrative  . None    Her Allergies Are:  Allergies  Allergen Reactions  . Penicillins     REACTION: rash  :   Her Current Medications Are:  Outpatient Encounter Prescriptions as of 03/12/2017  Medication Sig  . Cholecalciferol (VITAMIN D3) 1000 units CAPS Take 1 capsule by mouth 2 (two) times daily.  . diclofenac sodium (VOLTAREN) 1 % GEL Apply topically 4 (four) times daily.  . Glucosamine HCl (GLUCOSAMINE PO) Take by mouth.  . Magnesium 250 MG TABS Take by mouth daily.  . Multiple Vitamins-Minerals (CENTRUM SILVER 50+WOMEN PO) Take 1 tablet by mouth daily.  . Multiple Vitamins-Minerals (PRESERVISION AREDS PO)  Take by mouth.  . Omega-3 Fatty Acids (FISH OIL PO) Take by mouth.  . vitamin B-12 (CYANOCOBALAMIN) 100 MCG tablet Take 100 mcg by mouth 2 (two) times daily.   No facility-administered encounter medications on file as of 03/12/2017.   :  Review of Systems:  Out of a complete 14 point review of systems, all are reviewed and negative with the exception of these symptoms as listed below:  Review of Systems  Neurological:       Pt presents today to discuss her sleep study results. Pt does not want to start a cpap.    Objective:  Neurological Exam  Physical Exam Physical Examination:   Vitals:   03/12/17 1043  BP: (!) 176/92  Pulse: (!) 102    General Examination: The patient is a very pleasant 76 y.o. female in no acute distress. She appears well-developed and well-nourished and well groomed. Little anxious today.  HEENT: Normocephalic, atraumatic, pupils are equal, round and reactive to light and accommodation. Extraocular tracking is good without limitation to gaze excursion or nystagmus noted. Normal smooth pursuit is noted. Hearing is grossly intact. Face is symmetric with normal facial animation and normal facial sensation. Speech is clear with no dysarthria noted. There is no hypophonia. There is no lip, neck/head, jaw or voice tremor. Neck is supple with full range of passive and active motion. There are no carotid bruits on auscultation. Oropharynx exam reveals: mild mouth dryness, adequate dental hygiene and mild to moderate airway crowding. Mallampati is class II. Tongue protrudes centrally and palate elevates symmetrically. She has a Mild overbite.    Chest: Clear to auscultation without wheezing, rhonchi or crackles noted.  Heart: S1+S2+0, regular and normal without murmurs, rubs or gallops noted.   Abdomen: Soft, non-tender and non-distended with normal bowel sounds appreciated on auscultation.  Extremities: There is 1+ pitting edema in the distal lower extremities  bilaterally, L more than R. Pedal pulses are intact.  Skin: Warm and dry without trophic changes noted. There are no varicose veins.  Musculoskeletal: exam reveals no obvious joint deformities, tenderness or joint swelling or erythema, with the exception of left shoulder higher than right, she has a history of left humerus fracture and has decreased range of motion in her left shoulder joint, stable.   Neurologically:  Mental status: The patient is awake, alert and oriented in all 4 spheres. Her immediate and remote memory, attention, language skills and fund of knowledge are appropriate. There is no evidence of aphasia, agnosia, apraxia or anomia. Speech is clear with normal prosody and enunciation. Thought process is linear. Mood is normal and affect is normal.  Cranial nerves II - XII are as described above under HEENT exam. Motor exam: Normal bulk, strength and tone is noted. There is no drift, tremor or rebound. Romberg is negative, except for mild sway. Reflexes are 1+ throughout. Fine motor skills and coordination: intact with normal finger taps, normal hand movements, normal rapid alternating patting, normal foot taps and normal foot agility.  Cerebellar testing: No dysmetria or intention tremor on finger to nose testing. Heel to shin is not tested d/t knee pain. Sensory exam: intact to light touch in the upper and lower extremities.  Gait, station and balance: She stands with mild difficulty. No veering to one side is noted. No leaning to one side is noted. Posture is mildly stooped Gait shows slightly slow and cautious gait, she has mild difficulty with tandem walk.  Assessment and Plan:   In summary, NESIAH JUMP is a very pleasant 76 y.o.-year old female with an underlying medical history of arthritis, skin cancer, left bundle branch block, hyperlipidemia, and obesity, who presents for follow-up consultation of her sleep disordered breathing, after a recent sleep study testing. Her  baseline sleep study from 03/01/17 showed mild to moderate OSA. We talked about the results in detail and we discussed possible treatment options again today. She can consider weight loss and avoiding supine sleep, CPAP vs. A dental appliance. Her physical exam is stable.  I explained to her that CPAP will likely be the fasted treatment options, which may result in improvement of her daytime somnolence, nocturia, headaches and sleep quality/consolidation. She would like to think about this and discuss with her husband too. She will call us back. I will see her back as needed at this time. I answered all her numerous good questions today and the patient was in agreement.  I spent 25 minutes in total face-to-face time with the patient, more than 50% of which was spent in counseling and coordination of care, reviewing test results, reviewing medication and discussing or reviewing the diagnosis of OSA, its prognosis and treatment options. Pertinent laboratory and imaging test results that were available during this visit with the patient were reviewed by me and considered in my medical decision making (see chart for details).

## 2017-05-16 DIAGNOSIS — Z23 Encounter for immunization: Secondary | ICD-10-CM | POA: Diagnosis not present

## 2017-06-08 DIAGNOSIS — H25811 Combined forms of age-related cataract, right eye: Secondary | ICD-10-CM | POA: Diagnosis not present

## 2017-06-08 DIAGNOSIS — H524 Presbyopia: Secondary | ICD-10-CM | POA: Diagnosis not present

## 2017-06-08 DIAGNOSIS — H353131 Nonexudative age-related macular degeneration, bilateral, early dry stage: Secondary | ICD-10-CM | POA: Diagnosis not present

## 2017-06-08 DIAGNOSIS — Z961 Presence of intraocular lens: Secondary | ICD-10-CM | POA: Diagnosis not present

## 2017-07-13 DIAGNOSIS — R8290 Unspecified abnormal findings in urine: Secondary | ICD-10-CM | POA: Diagnosis not present

## 2017-07-13 DIAGNOSIS — M1990 Unspecified osteoarthritis, unspecified site: Secondary | ICD-10-CM | POA: Diagnosis not present

## 2017-07-13 DIAGNOSIS — G4700 Insomnia, unspecified: Secondary | ICD-10-CM | POA: Diagnosis not present

## 2017-07-13 DIAGNOSIS — E7849 Other hyperlipidemia: Secondary | ICD-10-CM | POA: Diagnosis not present

## 2017-07-13 DIAGNOSIS — E669 Obesity, unspecified: Secondary | ICD-10-CM | POA: Diagnosis not present

## 2017-07-13 DIAGNOSIS — I447 Left bundle-branch block, unspecified: Secondary | ICD-10-CM | POA: Diagnosis not present

## 2017-07-13 DIAGNOSIS — M859 Disorder of bone density and structure, unspecified: Secondary | ICD-10-CM | POA: Diagnosis not present

## 2017-07-13 DIAGNOSIS — Z6830 Body mass index (BMI) 30.0-30.9, adult: Secondary | ICD-10-CM | POA: Diagnosis not present

## 2017-07-13 DIAGNOSIS — N390 Urinary tract infection, site not specified: Secondary | ICD-10-CM | POA: Diagnosis not present

## 2017-07-13 DIAGNOSIS — G4739 Other sleep apnea: Secondary | ICD-10-CM | POA: Diagnosis not present

## 2017-09-08 DIAGNOSIS — M1712 Unilateral primary osteoarthritis, left knee: Secondary | ICD-10-CM | POA: Diagnosis not present

## 2017-09-08 DIAGNOSIS — M170 Bilateral primary osteoarthritis of knee: Secondary | ICD-10-CM | POA: Diagnosis not present

## 2017-09-08 DIAGNOSIS — M1711 Unilateral primary osteoarthritis, right knee: Secondary | ICD-10-CM | POA: Diagnosis not present

## 2017-10-08 DIAGNOSIS — M25562 Pain in left knee: Secondary | ICD-10-CM | POA: Diagnosis not present

## 2017-10-08 DIAGNOSIS — M1712 Unilateral primary osteoarthritis, left knee: Secondary | ICD-10-CM | POA: Diagnosis not present

## 2017-10-13 DIAGNOSIS — M1711 Unilateral primary osteoarthritis, right knee: Secondary | ICD-10-CM | POA: Diagnosis not present

## 2017-10-13 DIAGNOSIS — M1712 Unilateral primary osteoarthritis, left knee: Secondary | ICD-10-CM | POA: Diagnosis not present

## 2017-12-13 IMAGING — NM NM MISC PROCEDURE
3 series · 18 of 18 positions shown · non-contrast
Comparison: none

[Series 1: wbr_s-proj_st stress_(id)_sa · 6.5mm · 6.51mm/px · 6 of 64 frames shown (1 of 2)]
[frame 6/64]
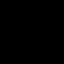
[frame 16/64]
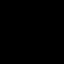
[frame 27/64]
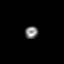
[frame 38/64]
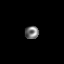
[frame 48/64]
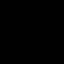
[frame 59/64]
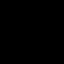

[Series 1: wbr_r-proj_st rest_(id)_sa · 6.5mm · 6.51mm/px · 6 of 64 frames shown]
[frame 6/64]
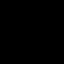
[frame 16/64]
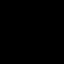
[frame 27/64]
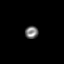
[frame 38/64]
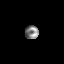
[frame 48/64]
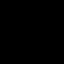
[frame 59/64]
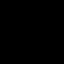

[Series 1: wbr_s-proj_st stress_(id)_sa · 6.5mm · 6.51mm/px · 6 of 512 frames shown (2 of 2)]
[frame 43/512]
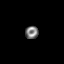
[frame 128/512]
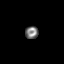
[frame 214/512]
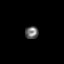
[frame 299/512]
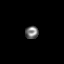
[frame 384/512]
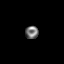
[frame 470/512]
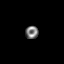

[18 of 18 positions shown; findings below may reference images not displayed]

Canned report from images found in remote index.

Refer to host system for actual result text.

## 2018-01-07 DIAGNOSIS — I8311 Varicose veins of right lower extremity with inflammation: Secondary | ICD-10-CM | POA: Diagnosis not present

## 2018-01-07 DIAGNOSIS — I8312 Varicose veins of left lower extremity with inflammation: Secondary | ICD-10-CM | POA: Diagnosis not present

## 2018-01-14 DIAGNOSIS — R82998 Other abnormal findings in urine: Secondary | ICD-10-CM | POA: Diagnosis not present

## 2018-01-14 DIAGNOSIS — E7849 Other hyperlipidemia: Secondary | ICD-10-CM | POA: Diagnosis not present

## 2018-01-14 DIAGNOSIS — M859 Disorder of bone density and structure, unspecified: Secondary | ICD-10-CM | POA: Diagnosis not present

## 2018-01-18 DIAGNOSIS — Z6830 Body mass index (BMI) 30.0-30.9, adult: Secondary | ICD-10-CM | POA: Diagnosis not present

## 2018-01-18 DIAGNOSIS — M1990 Unspecified osteoarthritis, unspecified site: Secondary | ICD-10-CM | POA: Diagnosis not present

## 2018-01-18 DIAGNOSIS — Z1389 Encounter for screening for other disorder: Secondary | ICD-10-CM | POA: Diagnosis not present

## 2018-01-18 DIAGNOSIS — G4700 Insomnia, unspecified: Secondary | ICD-10-CM | POA: Diagnosis not present

## 2018-01-18 DIAGNOSIS — Z0000 Encounter for general adult medical examination without abnormal findings: Secondary | ICD-10-CM | POA: Diagnosis not present

## 2018-01-18 DIAGNOSIS — E7849 Other hyperlipidemia: Secondary | ICD-10-CM | POA: Diagnosis not present

## 2018-01-18 DIAGNOSIS — E669 Obesity, unspecified: Secondary | ICD-10-CM | POA: Diagnosis not present

## 2018-01-18 DIAGNOSIS — M859 Disorder of bone density and structure, unspecified: Secondary | ICD-10-CM | POA: Diagnosis not present

## 2018-01-18 DIAGNOSIS — G4739 Other sleep apnea: Secondary | ICD-10-CM | POA: Diagnosis not present

## 2018-01-18 DIAGNOSIS — I447 Left bundle-branch block, unspecified: Secondary | ICD-10-CM | POA: Diagnosis not present

## 2018-01-19 DIAGNOSIS — I8311 Varicose veins of right lower extremity with inflammation: Secondary | ICD-10-CM | POA: Diagnosis not present

## 2018-01-19 DIAGNOSIS — Z1212 Encounter for screening for malignant neoplasm of rectum: Secondary | ICD-10-CM | POA: Diagnosis not present

## 2018-01-19 DIAGNOSIS — I83813 Varicose veins of bilateral lower extremities with pain: Secondary | ICD-10-CM | POA: Diagnosis not present

## 2018-01-19 DIAGNOSIS — I8312 Varicose veins of left lower extremity with inflammation: Secondary | ICD-10-CM | POA: Diagnosis not present

## 2018-01-19 DIAGNOSIS — I83893 Varicose veins of bilateral lower extremities with other complications: Secondary | ICD-10-CM | POA: Diagnosis not present

## 2018-02-09 DIAGNOSIS — J324 Chronic pansinusitis: Secondary | ICD-10-CM | POA: Diagnosis not present

## 2018-02-09 DIAGNOSIS — R05 Cough: Secondary | ICD-10-CM | POA: Diagnosis not present

## 2018-02-11 DIAGNOSIS — M1712 Unilateral primary osteoarthritis, left knee: Secondary | ICD-10-CM | POA: Diagnosis not present

## 2018-02-17 ENCOUNTER — Encounter: Payer: Self-pay | Admitting: Allergy and Immunology

## 2018-02-17 ENCOUNTER — Ambulatory Visit (INDEPENDENT_AMBULATORY_CARE_PROVIDER_SITE_OTHER): Payer: Medicare Other | Admitting: Allergy and Immunology

## 2018-02-17 VITALS — BP 126/72 | HR 86 | Resp 18 | Ht 64.0 in | Wt 183.0 lb

## 2018-02-17 DIAGNOSIS — T50905D Adverse effect of unspecified drugs, medicaments and biological substances, subsequent encounter: Secondary | ICD-10-CM

## 2018-02-17 MED ORDER — PENICILLIN V POTASSIUM 250 MG/5ML PO SOLR: ORAL | 0 refills | Status: DC

## 2018-02-17 NOTE — Patient Instructions (Signed)
Drug allergy - Continue to avoid products containing penicillin until after testing is completed - Avoid all antihistamines for 3 days prior to your appointment for testing - I will call in a penicillin prescription to your pharmacy. You can pick this up at anytime before your appointment for testing. Bring this medication to the testing appointment. - Make an appointment testing that is convenient for you  - Call the office with any questions you may have in the meantime.

## 2018-02-17 NOTE — Progress Notes (Addendum)
NEW PATIENT NOTE  Referring Provider: Burnard Bunting, MD Primary Provider: Burnard Bunting, MD Date of office visit: 02/17/2018    Subjective:   Chief Complaint:  Susan Bruce (DOB: June 12, 1941) is a 77 y.o. female who presents to the clinic on 02/17/2018 with a chief complaint of Medication Allergy .  HPI:  Susan Bruce is a 77 year old female who presents to the clinic for an evaluation of a drug allergy. She reports that, in 2004, her appendix ruptured and required a hospital admission and an emergency surgery. During the course of her illness she received Zosyn. Her husband noted that she developed a raised red rash on her back, neck, and scalp. At that time, the Zosyn was stopped and the rash resolved. She reports she did not experience concomitant cardiopulmonary or gastrointestinal issues with the rash. Since that time, she has remained away from medications containing penicillin. Susan Bruce is interested in discovering if she can use penicillin again as she is having a total left knee replacement in the near future. She aosl has a history of having 1-2 sinus infections a year throughout her adult life for which she is followed by Dr. Wilburn Cornelia, ENT specialist, who would like to be able to give a medication with penicillin in the future.   She denies a history of asthma, eczema, and no food allergies.  Past Medical History:  Diagnosis Date  . Arthritis   . Cancer ([place])    Skin cancer- basal- leg  . Recurrent upper respiratory infection ([facility])     Past Surgical History:  Procedure Laterality Date  . APPENDECTOMY     open incision  . DILATION AND CURETTAGE OF UTERUS    . HERNIA REPAIR     incisional - after appendectomy  . KNEE ARTHROSCOPY W/ MEDIAL COLLATERAL LIGAMENT (MCL) REPAIR Right   . SMALL INTESTINE SURGERY     with appendectomy    Allergies as of 02/17/2018      Reactions   Penicillins    REACTION: rash      Medication List        Accurate as of 02/17/18 11:59 PM.  Always use your most recent med list.          cefdinir 300 MG capsule Commonly known as:  OMNICEF cefdinir 300 mg capsule   CENTRUM SILVER 50+WOMEN PO Take 1 tablet by mouth daily.   PRESERVISION AREDS PO Take by mouth.   FISH OIL PO Take by mouth.   GLUCOSAMINE PO Take by mouth.   Magnesium 250 MG Tabs Take by mouth daily.   penicillin v potassium 250 MG/5ML solution Commonly known as:  VEETID DO NOT MIX. Patient to bring to office for medication challenge.   vitamin B-12 100 MCG tablet Commonly known as:  CYANOCOBALAMIN Take 100 mcg by mouth 2 (two) times daily.   Vitamin D3 1000 units Caps Take 1 capsule by mouth 2 (two) times daily.   VOLTAREN 1 % Gel Generic drug:  diclofenac sodium Voltaren 1 % topical gel  APPLY 2 GRAM TO THE AFFECTED AREA(S) BY TOPICAL ROUTE 4 TIMES PER DAY       Review of systems negative except as noted in HPI / PMHx or noted below:  Review of Systems  Constitutional: Negative.   HENT: Negative.   Respiratory: Negative.   Cardiovascular: Negative.   Gastrointestinal: Negative.   Genitourinary: Negative.   Musculoskeletal: Negative.   Skin: Negative.   Neurological: Negative.   Endo/Heme/Allergies: Negative.  Psychiatric/Behavioral: Negative.     Family History  Problem Relation Age of Onset  . Aortic stenosis Mother   . Atrial fibrillation Mother   . Neuropathy Mother   . Melanoma Father   . Cancer Maternal Grandmother   . Cancer Paternal Grandmother     Social History   Socioeconomic History  . Marital status: Married    Spouse name: Not on file  . Number of children: Not on file  . Years of education: Not on file  . Highest education level: Not on file  Occupational History  . Not on file  Social Needs  . Financial resource strain: Not on file  . Food insecurity:    Worry: Not on file    Inability: Not on file  . Transportation needs:    Medical: Not on file    Non-medical: Not on file  Tobacco Use    . Smoking status: Never Smoker  . Smokeless tobacco: Never Used  Substance and Sexual Activity  . Alcohol use: Yes    Alcohol/week: 3.0 oz    Types: 5 Glasses of wine per week  . Drug use: No  . Sexual activity: Not on file    Comment: married  Lifestyle  . Physical activity:    Days per week: Not on file    Minutes per session: Not on file  . Stress: Not on file  Relationships  . Social connections:    Talks on phone: Not on file    Gets together: Not on file    Attends religious service: Not on file    Active member of club or organization: Not on file    Attends meetings of clubs or organizations: Not on file    Relationship status: Not on file  . Intimate partner violence:    Fear of current or ex partner: Not on file    Emotionally abused: Not on file    Physically abused: Not on file    Forced sexual activity: Not on file  Other Topics Concern  . Not on file  Social History Narrative  . Not on file    Environmental and Social history She lives in a 75 year old house with no concern for water damage or mildew.  Flooring is wood throughout.  Heating is gas and cooling is central.  There are no plastic or dust mite free covers on the bed or pillow.  There is no for smoconcern ke, fumes, chemicals, or dust at the home. When  Objective:   Vitals:   02/17/18 1007  BP: 126/72  Pulse: 86  Resp: 18  SpO2: 98%   Height: 5\' 4"  (162.6 cm) Weight: 183 lb (83 kg)  Physical Exam  Constitutional: She is oriented to person, place, and time. She appears well-developed and well-nourished.  HENT:  Head: Normocephalic.  Right Ear: External ear normal.  Left Ear: External ear normal.  Mouth/Throat: Oropharynx is clear and moist.  Bilateral nares slightly erythematous and edematous with clear nasal drainage noted.  Ears normal.  Eyes normal.  Pharynx normal.  Eyes: Conjunctivae are normal.  Neck: Normal range of motion. Neck supple.  Cardiovascular: Normal rate, regular  rhythm and normal heart sounds.  No murmur noted  Pulmonary/Chest: Effort normal and breath sounds normal.  Lungs clear to auscultation  Musculoskeletal: Normal range of motion.  Neurological: She is alert and oriented to person, place, and time.  Skin: Skin is warm and dry.  No skin rash noted at the office visit today  Psychiatric: She has a normal mood and affect. Her behavior is normal. Judgment and thought content normal.  Vitals reviewed.    Assessment and Plan:    1. Adverse effect of drug, subsequent encounter     Patient Instructions  Drug allergy - Continue to avoid products containing penicillin until after testing is completed - Avoid all antihistamines for 3 days prior to your appointment for testing - I will call in a penicillin prescription to your pharmacy. You can pick this up at anytime before your appointment for testing. Bring this medication to the testing appointment. - Make an appointment testing that is convenient for you  - Call the office with any questions you may have in the meantime.   Susan Bruce will return to the office at a time that is convenient for her and we will proceed with proceed with an office penicillin challenge.  Thank you for the opportunity to care for this patient.  Please do not hesitate to contact me with questions.  Gareth Morgan, FNP [facility]  I have provided oversight concerning Gareth Morgan' evaluation and treatment of this patient's health issues addressed during today's encounter. I agree with the assessment and therapeutic plan as outlined in the note.   Signed,   Jiles Prows, MD,  Allergy and Immunology,  [facility] [place].

## 2018-02-18 ENCOUNTER — Encounter: Payer: Self-pay | Admitting: Allergy and Immunology

## 2018-02-18 DIAGNOSIS — T50905A Adverse effect of unspecified drugs, medicaments and biological substances, initial encounter: Secondary | ICD-10-CM | POA: Insufficient documentation

## 2018-03-05 DIAGNOSIS — R05 Cough: Secondary | ICD-10-CM | POA: Diagnosis not present

## 2018-03-05 DIAGNOSIS — R0981 Nasal congestion: Secondary | ICD-10-CM | POA: Diagnosis not present

## 2018-03-05 DIAGNOSIS — J343 Hypertrophy of nasal turbinates: Secondary | ICD-10-CM | POA: Diagnosis not present

## 2018-03-05 DIAGNOSIS — J3489 Other specified disorders of nose and nasal sinuses: Secondary | ICD-10-CM | POA: Diagnosis not present

## 2018-03-05 DIAGNOSIS — J324 Chronic pansinusitis: Secondary | ICD-10-CM | POA: Diagnosis not present

## 2018-03-08 DIAGNOSIS — H524 Presbyopia: Secondary | ICD-10-CM | POA: Diagnosis not present

## 2018-03-08 DIAGNOSIS — H353131 Nonexudative age-related macular degeneration, bilateral, early dry stage: Secondary | ICD-10-CM | POA: Diagnosis not present

## 2018-03-08 DIAGNOSIS — H43811 Vitreous degeneration, right eye: Secondary | ICD-10-CM | POA: Diagnosis not present

## 2018-03-08 DIAGNOSIS — H25811 Combined forms of age-related cataract, right eye: Secondary | ICD-10-CM | POA: Diagnosis not present

## 2018-03-16 ENCOUNTER — Ambulatory Visit: Payer: Medicare Other | Admitting: Allergy and Immunology

## 2018-03-17 ENCOUNTER — Ambulatory Visit (INDEPENDENT_AMBULATORY_CARE_PROVIDER_SITE_OTHER): Payer: Medicare Other | Admitting: Family Medicine

## 2018-03-17 ENCOUNTER — Encounter: Payer: Self-pay | Admitting: Family Medicine

## 2018-03-17 VITALS — BP 140/70 | Temp 98.0°F | Resp 18

## 2018-03-17 DIAGNOSIS — T50905D Adverse effect of unspecified drugs, medicaments and biological substances, subsequent encounter: Secondary | ICD-10-CM

## 2018-03-17 NOTE — Patient Instructions (Signed)
Open graded penicillin oral challenge: The patient was able to tolerate the challenge today without adverse signs or symptoms. Vital signs were stable throughout the challenge and observation period. She received multiple doses separated by 20 minutes, each of which was separated by vitals and a brief physical exam. She received the following doses: 5 mg, 50 mg, and 405 mg :She  was monitored for 60 minutes following the last dose.   The patient was able to tolerate the open graded oral challenge today without adverse signs or symptoms. Therefore, she has the same risk of systemic reaction associated with the use of penicillin as the general population.  - Monitor for allergic symptoms such as rash, wheezing, diarrhea, swelling, and vomiting for the next 24 hours.  For less severs symptoms treat with Benadryl 50 mg every 4 hours and call the clinic. If life threatening symptoms occur call 911 immediately.   Call me if you have any questions  Follow up as needed

## 2018-03-17 NOTE — Progress Notes (Signed)
   [address] [place] [address] Dept: [phone]  FOLLOW UP NOTE  Patient ID: Susan Bruce, female    DOB: [birth year]  Age: 77 y.o. MRN: [id number] Date of Office Visit: 03/17/2018  Assessment  Chief Complaint: Food/Drug Challenge [facility])  HPI Susan Bruce is a 77 year old female who presents to the clinic for a penicillin drug challenge. She was last seen in this clinic on 02/17/2018 by Dr. Neldon Mc for evaluaton of a drug allergy. At that time, she reported that in 2004 she had a rash that appeared after receiving IV Zosyn.   At today's visit, she reports she is feeling well. She denies any cardiopulmonary or gastrointestinal symptoms. She denies current rash.   Her current medications are listed in the chart.    Drug Allergies:  Allergies  Allergen Reactions  . Penicillins     REACTION: rash    Physical Exam: BP 140/70   Temp 98 F (36.7 C) (Oral)   Resp 18   LMP  (Exact Date)   SpO2 96%    Physical Exam  Constitutional: She is oriented to person, place, and time. She appears well-developed and well-nourished.  HENT:  Head: Normocephalic.  Right Ear: External ear normal.  Left Ear: External ear normal.  Nose: Nose normal.  Mouth/Throat: Oropharynx is clear and moist.  Eyes: Conjunctivae are normal.  Neck: Normal range of motion. Neck supple.  Cardiovascular: Normal rate, regular rhythm and normal heart sounds.  No murmur noted  Pulmonary/Chest: Effort normal and breath sounds normal.  Lungs clear to auscultation  Musculoskeletal: Normal range of motion.  Neurological: She is alert and oriented to person, place, and time.  Skin: Skin is warm and dry.  Psychiatric: She has a normal mood and affect. Her behavior is normal. Judgment and thought content normal.  Vitals reviewed.   Assessment and Plan: 1. Adverse effect of drug, subsequent encounter      Patient Instructions  Open graded penicillin oral challenge: The patient was able to tolerate the  challenge today without adverse signs or symptoms. Vital signs were stable throughout the challenge and observation period. She received multiple doses separated by 20 minutes, each of which was separated by vitals and a brief physical exam. She received the following doses: 5 mg, 50 mg, and 405 mg :She  was monitored for 60 minutes following the last dose.   The patient was able to tolerate the open graded oral challenge today without adverse signs or symptoms. Therefore, she has the same risk of systemic reaction associated with the use of penicillin as the general population.  - Monitor for allergic symptoms such as rash, wheezing, diarrhea, swelling, and vomiting for the next 24 hours.  For less severs symptoms treat with Benadryl 50 mg every 4 hours and call the clinic. If life threatening symptoms occur call 911 immediately.   Call me if you have any questions  Follow up as needed   Return if symptoms worsen or fail to improve.    Thank you for the opportunity to care for this patient.  Please do not hesitate to contact me with questions.  Gareth Morgan, FNP [facility]

## 2018-03-18 ENCOUNTER — Ambulatory Visit: Payer: Medicare Other | Admitting: Allergy

## 2018-03-18 NOTE — Addendum Note (Signed)
Addended by: Isabel Caprice on: 03/18/2018 05:24 PM   Modules accepted: Orders

## 2018-04-01 DIAGNOSIS — M1712 Unilateral primary osteoarthritis, left knee: Secondary | ICD-10-CM | POA: Diagnosis not present

## 2018-04-08 DIAGNOSIS — M1712 Unilateral primary osteoarthritis, left knee: Secondary | ICD-10-CM | POA: Diagnosis not present

## 2018-04-12 ENCOUNTER — Encounter (HOSPITAL_COMMUNITY): Payer: Self-pay | Admitting: Physician Assistant

## 2018-04-12 ENCOUNTER — Encounter (HOSPITAL_COMMUNITY)
Admission: RE | Admit: 2018-04-12 | Discharge: 2018-04-12 | Disposition: A | Discharge status: Home / Self Care | Payer: Medicare Other | Source: Ambulatory Visit | Attending: Orthopedic Surgery | Admitting: Orthopedic Surgery

## 2018-04-12 ENCOUNTER — Encounter (HOSPITAL_COMMUNITY): Payer: Self-pay

## 2018-04-12 ENCOUNTER — Other Ambulatory Visit: Payer: Self-pay

## 2018-04-12 DIAGNOSIS — Z01812 Encounter for preprocedural laboratory examination: Secondary | ICD-10-CM | POA: Diagnosis not present

## 2018-04-12 HISTORY — DX: Bronchitis, not specified as acute or chronic: J40

## 2018-04-12 HISTORY — DX: Presence of spectacles and contact lenses: Z97.3

## 2018-04-12 HISTORY — DX: Headache, unspecified: R51.9

## 2018-04-12 HISTORY — DX: Sleep apnea, unspecified: G47.30

## 2018-04-12 HISTORY — DX: Other disorder of circulatory system: I99.8

## 2018-04-12 HISTORY — DX: Headache: R51

## 2018-04-12 LAB — BASIC METABOLIC PANEL
ANION GAP: 8 (ref 5–15)
BUN: 12 mg/dL (ref 8–23)
CALCIUM: 10.4 mg/dL — AB (ref 8.9–10.3)
CO2: 25 mmol/L (ref 22–32)
Chloride: 107 mmol/L (ref 98–111)
Creatinine, Ser: 0.7 mg/dL (ref 0.44–1.00)
GFR calc Af Amer: >60 mL/min (ref >60)
GLUCOSE: 113 mg/dL — AB (ref 70–99)
POTASSIUM: 4.7 mmol/L (ref 3.5–5.1)
SODIUM: 140 mmol/L (ref 135–145)

## 2018-04-12 LAB — CBC
HCT: 48.9 % — ABNORMAL HIGH (ref 36.0–46.0)
HEMOGLOBIN: 15.7 g/dL — AB (ref 12.0–15.0)
MCH: 30.4 pg (ref 26.0–34.0)
MCHC: 32.1 g/dL (ref 30.0–36.0)
MCV: 94.6 fL (ref 78.0–100.0)
Platelets: 195 10*3/uL (ref 150–400)
RBC: 5.17 MIL/uL — ABNORMAL HIGH (ref 3.87–5.11)
RDW: 12.5 % (ref 11.5–15.5)
WBC: 6 10*3/uL (ref 4.0–10.5)

## 2018-04-12 LAB — SURGICAL PCR SCREEN
MRSA, PCR: NEGATIVE
STAPHYLOCOCCUS AUREUS: NEGATIVE

## 2018-04-12 NOTE — Progress Notes (Signed)
Pt denies SOB, chest pain, and being under the care of a cardiologist. Pt denies having a cardiac cath and an echo. Pt denies having an EKG and chest x ray within the last year. Pt denies recent labs. Anesthesia Jeneen Rinks, [place]) stated that pt did not need EKG after reviewing previous EKGs in Epic from May 2016 , stress test result and current history.  Pt PCP is Jeanie Cooks.

## 2018-04-13 NOTE — H&P (Signed)
Anticipated LOS equal to or greater than 2 midnights due to - Age 75 and older with one or more of the following:  - Obesity  - Expected need for hospital services (PT, OT, Nursing) required for safe  discharge  - Anticipated need for postoperative skilled nursing care or inpatient rehab      Susan Bruce is an 77 y.o. female.    Chief Complaint: left knee pain  HPI: Pt is a 77 y.o. female complaining of left knee pain for multiple years. Pain had continually increased since the beginning. X-rays in the clinic show end-stage arthritic changes of the left knee. Pt has tried various conservative treatments which have failed to alleviate their symptoms, including injections and therapy. Various options are discussed with the patient. Risks, benefits and expectations were discussed with the patient. Patient understand the risks, benefits and expectations and wishes to proceed with surgery.   PCP:  Burnard Bunting, MD  D/C Plans: Home  PMH: Past Medical History:  Diagnosis Date  . Arthritis   . Bronchitis    PMH  . Cancer ([place])    Skin cancer- basal- leg  . Headache   . Recurrent upper respiratory infection (URI)   . Sleep apnea    pt denies ( see sleep study)  . Vascular insufficiency    wears TED Hose  . Wears contact lenses    right eye    PSH: Past Surgical History:  Procedure Laterality Date  . APPENDECTOMY     open incision  . CATARACT EXTRACTION W/ INTRAOCULAR LENS IMPLANT     left eye  . DILATION AND CURETTAGE OF UTERUS    . HERNIA REPAIR     incisional - after appendectomy  . KNEE ARTHROSCOPY W/ MEDIAL COLLATERAL LIGAMENT (MCL) REPAIR Right   . SMALL INTESTINE SURGERY     with appendectomy  . WISDOM TOOTH EXTRACTION      Social History:  reports that she has never smoked. She has never used smokeless tobacco. She reports that she drinks about 5.0 standard drinks of alcohol per week. She reports that she does not use drugs.  Allergies:  Allergies  Allergen  Reactions  . Other     Band Aid causes redness    Medications: No current facility-administered medications for this encounter.    Current Outpatient Medications  Medication Sig Dispense Refill  . Cholecalciferol (VITAMIN D3) 2000 units TABS Take 1 capsule by mouth daily.     . Glucosamine HCl (GLUCOSAMINE PO) Take 1 tablet by mouth 2 (two) times daily.     . Magnesium 250 MG TABS Take 250 mg by mouth daily.     . Multiple Vitamins-Minerals (CENTRUM SILVER 50+WOMEN PO) Take 1 tablet by mouth daily.    . Multiple Vitamins-Minerals (PRESERVISION AREDS PO) Take 1 capsule by mouth 2 (two) times daily.     . Omega-3 Fatty Acids (FISH OIL PO) Take 1,400 mg by mouth daily.     Marland Kitchen OVER THE COUNTER MEDICATION Take 1 tablet by mouth daily. V-Vein Formula    . vitamin B-12 (CYANOCOBALAMIN) 1000 MCG tablet Take 1,000 mcg by mouth 2 (two) times daily.       Results for orders placed or performed during the hospital encounter of 04/12/18 (from the past 48 hour(s))  Surgical pcr screen     Status: None   Collection Time: 04/12/18 11:03 AM  Result Value Ref Range   MRSA, PCR NEGATIVE NEGATIVE   Staphylococcus aureus NEGATIVE NEGATIVE  Comment: (NOTE) The Xpert SA Assay (FDA approved for NASAL specimens in patients 50 years of age and older), is one component of a comprehensive surveillance program. It is not intended to diagnose infection nor to guide or monitor treatment. Performed at [facility], [place] [address] [place], [place] [address]   Basic metabolic panel     Status: Abnormal   Collection Time: 04/12/18 11:03 AM  Result Value Ref Range   Sodium 140 135 - 145 mmol/L   Potassium 4.7 3.5 - 5.1 mmol/L   Chloride 107 98 - 111 mmol/L   CO2 25 22 - 32 mmol/L   Glucose, Bld 113 (H) 70 - 99 mg/dL   BUN 12 8 - 23 mg/dL   Creatinine, Ser 0.70 0.44 - 1.00 mg/dL   Calcium 10.4 (H) 8.9 - 10.3 mg/dL   GFR calc non Af Amer >60 >60 mL/min   GFR calc Af Amer >60 >60 mL/min    Comment:  (NOTE) The eGFR has been calculated using the CKD EPI equation. This calculation has not been validated in all clinical situations. eGFR's persistently <60 mL/min signify possible Chronic Kidney Disease.    Anion gap 8 5 - 15    Comment: Performed at [facility] [address], [place] [address]  CBC     Status: Abnormal   Collection Time: 04/12/18 11:03 AM  Result Value Ref Range   WBC 6.0 4.0 - 10.5 K/uL   RBC 5.17 (H) 3.87 - 5.11 MIL/uL   Hemoglobin 15.7 (H) 12.0 - 15.0 g/dL   HCT 48.9 (H) 36.0 - 46.0 %   MCV 94.6 78.0 - 100.0 fL   MCH 30.4 26.0 - 34.0 pg   MCHC 32.1 30.0 - 36.0 g/dL   RDW 12.5 11.5 - 15.5 %   Platelets 195 150 - 400 K/uL    Comment: Performed at [facility], [place] [address], [place], [place] [address]   No results found.  ROS: Pain with rom of the left lower extremity  Physical Exam: Alert and oriented 77 y.o. female in no acute distress Cranial nerves 2-12 intact Cervical spine: full rom with no tenderness, nv intact distally Chest: active breath sounds bilaterally, no wheeze rhonchi or rales Heart: regular rate and rhythm, no murmur Abd: non tender non distended with active bowel sounds Hip is stable with rom  Left knee crepitus and pain with rom nv intact distally Antalgic gait No rashes   Assessment/Plan Assessment: left knee end stage osteoarthritis  Plan:  Patient will undergo a left total knee by Dr. Veverly Fells at [facility]. Risks benefits and expectations were discussed with the patient. Patient understand risks, benefits and expectations and wishes to proceed. Preoperative templating of the joint replacement has been completed, documented, and submitted to the Operating Room personnel in order to optimize intra-operative equipment management.   Merla Riches PA-C, MPAS [facility] is now [facility] [address], [place], [place], [place] [address] Phone:  [phone] [email or web address] Facebook  [employer]

## 2018-04-13 NOTE — Addendum Note (Signed)
Addended by: Isabel Caprice on: 04/13/2018 05:11 PM   Modules accepted: Orders

## 2018-04-15 DIAGNOSIS — D329 Benign neoplasm of meninges, unspecified: Secondary | ICD-10-CM | POA: Diagnosis not present

## 2018-04-15 DIAGNOSIS — M25562 Pain in left knee: Secondary | ICD-10-CM | POA: Diagnosis not present

## 2018-04-15 DIAGNOSIS — J9811 Atelectasis: Secondary | ICD-10-CM | POA: Diagnosis not present

## 2018-04-15 DIAGNOSIS — S01511A Laceration without foreign body of lip, initial encounter: Secondary | ICD-10-CM | POA: Diagnosis not present

## 2018-04-15 DIAGNOSIS — S06330A Contusion and laceration of cerebrum, unspecified, without loss of consciousness, initial encounter: Secondary | ICD-10-CM | POA: Diagnosis not present

## 2018-04-15 DIAGNOSIS — S066X0A Traumatic subarachnoid hemorrhage without loss of consciousness, initial encounter: Secondary | ICD-10-CM | POA: Diagnosis not present

## 2018-04-15 DIAGNOSIS — R2689 Other abnormalities of gait and mobility: Secondary | ICD-10-CM | POA: Diagnosis not present

## 2018-04-15 DIAGNOSIS — S0990XA Unspecified injury of head, initial encounter: Secondary | ICD-10-CM | POA: Diagnosis not present

## 2018-04-15 DIAGNOSIS — I609 Nontraumatic subarachnoid hemorrhage, unspecified: Secondary | ICD-10-CM | POA: Diagnosis not present

## 2018-04-15 DIAGNOSIS — S06310A Contusion and laceration of right cerebrum without loss of consciousness, initial encounter: Secondary | ICD-10-CM | POA: Diagnosis not present

## 2018-04-15 DIAGNOSIS — W19XXXA Unspecified fall, initial encounter: Secondary | ICD-10-CM | POA: Diagnosis not present

## 2018-04-15 DIAGNOSIS — S52322A Displaced transverse fracture of shaft of left radius, initial encounter for closed fracture: Secondary | ICD-10-CM | POA: Diagnosis not present

## 2018-04-15 DIAGNOSIS — R51 Headache: Secondary | ICD-10-CM | POA: Diagnosis not present

## 2018-04-15 DIAGNOSIS — S8001XA Contusion of right knee, initial encounter: Secondary | ICD-10-CM | POA: Diagnosis not present

## 2018-04-15 DIAGNOSIS — S59291A Other physeal fracture of lower end of radius, right arm, initial encounter for closed fracture: Secondary | ICD-10-CM | POA: Diagnosis not present

## 2018-04-15 DIAGNOSIS — M7989 Other specified soft tissue disorders: Secondary | ICD-10-CM | POA: Diagnosis not present

## 2018-04-15 DIAGNOSIS — S0083XA Contusion of other part of head, initial encounter: Secondary | ICD-10-CM | POA: Diagnosis not present

## 2018-04-15 DIAGNOSIS — E041 Nontoxic single thyroid nodule: Secondary | ICD-10-CM | POA: Diagnosis not present

## 2018-04-15 DIAGNOSIS — S8002XA Contusion of left knee, initial encounter: Secondary | ICD-10-CM | POA: Diagnosis not present

## 2018-04-15 DIAGNOSIS — M79605 Pain in left leg: Secondary | ICD-10-CM | POA: Diagnosis not present

## 2018-04-15 DIAGNOSIS — S52502A Unspecified fracture of the lower end of left radius, initial encounter for closed fracture: Secondary | ICD-10-CM | POA: Diagnosis not present

## 2018-04-15 DIAGNOSIS — Y998 Other external cause status: Secondary | ICD-10-CM | POA: Diagnosis not present

## 2018-04-15 DIAGNOSIS — M25532 Pain in left wrist: Secondary | ICD-10-CM | POA: Diagnosis not present

## 2018-04-15 DIAGNOSIS — R102 Pelvic and perineal pain: Secondary | ICD-10-CM | POA: Diagnosis not present

## 2018-04-15 DIAGNOSIS — S062X0A Diffuse traumatic brain injury without loss of consciousness, initial encounter: Secondary | ICD-10-CM | POA: Diagnosis not present

## 2018-04-15 DIAGNOSIS — D320 Benign neoplasm of cerebral meninges: Secondary | ICD-10-CM | POA: Diagnosis not present

## 2018-04-15 DIAGNOSIS — M79652 Pain in left thigh: Secondary | ICD-10-CM | POA: Diagnosis not present

## 2018-04-15 DIAGNOSIS — S0993XA Unspecified injury of face, initial encounter: Secondary | ICD-10-CM | POA: Diagnosis not present

## 2018-04-16 DIAGNOSIS — D320 Benign neoplasm of cerebral meninges: Secondary | ICD-10-CM | POA: Diagnosis not present

## 2018-04-16 DIAGNOSIS — S8002XA Contusion of left knee, initial encounter: Secondary | ICD-10-CM | POA: Diagnosis not present

## 2018-04-16 DIAGNOSIS — W19XXXA Unspecified fall, initial encounter: Secondary | ICD-10-CM | POA: Diagnosis not present

## 2018-04-16 DIAGNOSIS — J9811 Atelectasis: Secondary | ICD-10-CM | POA: Diagnosis not present

## 2018-04-16 DIAGNOSIS — S52502A Unspecified fracture of the lower end of left radius, initial encounter for closed fracture: Secondary | ICD-10-CM | POA: Diagnosis not present

## 2018-04-16 DIAGNOSIS — E041 Nontoxic single thyroid nodule: Secondary | ICD-10-CM | POA: Diagnosis not present

## 2018-04-16 DIAGNOSIS — M7989 Other specified soft tissue disorders: Secondary | ICD-10-CM | POA: Diagnosis not present

## 2018-04-16 DIAGNOSIS — R102 Pelvic and perineal pain: Secondary | ICD-10-CM | POA: Diagnosis not present

## 2018-04-16 DIAGNOSIS — M80032A Age-related osteoporosis with current pathological fracture, left forearm, initial encounter for fracture: Secondary | ICD-10-CM | POA: Diagnosis not present

## 2018-04-16 DIAGNOSIS — M79652 Pain in left thigh: Secondary | ICD-10-CM | POA: Diagnosis not present

## 2018-04-18 ENCOUNTER — Ambulatory Visit (HOSPITAL_BASED_OUTPATIENT_CLINIC_OR_DEPARTMENT_OTHER)
Admission: RE | Admit: 2018-04-18 | Discharge: 2018-04-18 | Disposition: A | Discharge status: Home / Self Care | Payer: Medicare Other | Source: Ambulatory Visit | Attending: Physician Assistant | Admitting: Physician Assistant

## 2018-04-18 ENCOUNTER — Other Ambulatory Visit (HOSPITAL_BASED_OUTPATIENT_CLINIC_OR_DEPARTMENT_OTHER): Payer: Self-pay | Admitting: Physician Assistant

## 2018-04-18 DIAGNOSIS — S8012XA Contusion of left lower leg, initial encounter: Secondary | ICD-10-CM | POA: Diagnosis not present

## 2018-04-18 DIAGNOSIS — M7989 Other specified soft tissue disorders: Secondary | ICD-10-CM | POA: Insufficient documentation

## 2018-04-18 DIAGNOSIS — S80222A Blister (nonthermal), left knee, initial encounter: Secondary | ICD-10-CM | POA: Diagnosis not present

## 2018-04-20 DIAGNOSIS — M1712 Unilateral primary osteoarthritis, left knee: Secondary | ICD-10-CM | POA: Diagnosis not present

## 2018-04-20 DIAGNOSIS — M25532 Pain in left wrist: Secondary | ICD-10-CM | POA: Diagnosis not present

## 2018-04-20 DIAGNOSIS — S52562A Barton's fracture of left radius, initial encounter for closed fracture: Secondary | ICD-10-CM | POA: Diagnosis not present

## 2018-04-22 DIAGNOSIS — S52572A Other intraarticular fracture of lower end of left radius, initial encounter for closed fracture: Secondary | ICD-10-CM | POA: Diagnosis not present

## 2018-04-22 DIAGNOSIS — G8918 Other acute postprocedural pain: Secondary | ICD-10-CM | POA: Diagnosis not present

## 2018-04-22 DIAGNOSIS — D320 Benign neoplasm of cerebral meninges: Secondary | ICD-10-CM | POA: Diagnosis not present

## 2018-04-22 DIAGNOSIS — S52562A Barton's fracture of left radius, initial encounter for closed fracture: Secondary | ICD-10-CM | POA: Diagnosis not present

## 2018-04-22 DIAGNOSIS — X58XXXA Exposure to other specified factors, initial encounter: Secondary | ICD-10-CM | POA: Diagnosis not present

## 2018-04-22 DIAGNOSIS — Y999 Unspecified external cause status: Secondary | ICD-10-CM | POA: Diagnosis not present

## 2018-04-23 ENCOUNTER — Encounter (HOSPITAL_COMMUNITY): Admission: RE | Payer: Self-pay | Source: Ambulatory Visit

## 2018-04-23 ENCOUNTER — Inpatient Hospital Stay (HOSPITAL_COMMUNITY): Admission: RE | Admit: 2018-04-23 | Payer: Medicare Other | Source: Ambulatory Visit | Admitting: Orthopedic Surgery

## 2018-04-23 SURGERY — ARTHROPLASTY, KNEE, TOTAL
Anesthesia: General | Site: Knee | Laterality: Left

## 2018-04-30 DIAGNOSIS — H918X3 Other specified hearing loss, bilateral: Secondary | ICD-10-CM | POA: Diagnosis not present

## 2018-04-30 DIAGNOSIS — D329 Benign neoplasm of meninges, unspecified: Secondary | ICD-10-CM | POA: Diagnosis not present

## 2018-05-06 DIAGNOSIS — Z4789 Encounter for other orthopedic aftercare: Secondary | ICD-10-CM | POA: Diagnosis not present

## 2018-05-06 DIAGNOSIS — Z85828 Personal history of other malignant neoplasm of skin: Secondary | ICD-10-CM | POA: Diagnosis not present

## 2018-05-06 DIAGNOSIS — L820 Inflamed seborrheic keratosis: Secondary | ICD-10-CM | POA: Diagnosis not present

## 2018-05-06 DIAGNOSIS — S52562D Barton's fracture of left radius, subsequent encounter for closed fracture with routine healing: Secondary | ICD-10-CM | POA: Diagnosis not present

## 2018-05-11 DIAGNOSIS — G939 Disorder of brain, unspecified: Secondary | ICD-10-CM | POA: Diagnosis not present

## 2018-05-11 DIAGNOSIS — D329 Benign neoplasm of meninges, unspecified: Secondary | ICD-10-CM | POA: Diagnosis not present

## 2018-05-11 DIAGNOSIS — Z9181 History of falling: Secondary | ICD-10-CM | POA: Diagnosis not present

## 2018-05-11 DIAGNOSIS — Z79899 Other long term (current) drug therapy: Secondary | ICD-10-CM | POA: Diagnosis not present

## 2018-05-13 DIAGNOSIS — Z23 Encounter for immunization: Secondary | ICD-10-CM | POA: Diagnosis not present

## 2018-05-13 DIAGNOSIS — M25532 Pain in left wrist: Secondary | ICD-10-CM | POA: Diagnosis not present

## 2018-05-13 DIAGNOSIS — D320 Benign neoplasm of cerebral meninges: Secondary | ICD-10-CM | POA: Diagnosis not present

## 2018-05-13 DIAGNOSIS — M170 Bilateral primary osteoarthritis of knee: Secondary | ICD-10-CM | POA: Diagnosis not present

## 2018-05-21 DIAGNOSIS — S52572D Other intraarticular fracture of lower end of left radius, subsequent encounter for closed fracture with routine healing: Secondary | ICD-10-CM | POA: Diagnosis not present

## 2018-05-21 DIAGNOSIS — M25632 Stiffness of left wrist, not elsewhere classified: Secondary | ICD-10-CM | POA: Diagnosis not present

## 2018-05-21 DIAGNOSIS — Z5189 Encounter for other specified aftercare: Secondary | ICD-10-CM | POA: Diagnosis not present

## 2018-05-24 DIAGNOSIS — S93492A Sprain of other ligament of left ankle, initial encounter: Secondary | ICD-10-CM | POA: Diagnosis not present

## 2018-05-24 DIAGNOSIS — M25572 Pain in left ankle and joints of left foot: Secondary | ICD-10-CM | POA: Diagnosis not present

## 2018-05-26 DIAGNOSIS — M25632 Stiffness of left wrist, not elsewhere classified: Secondary | ICD-10-CM | POA: Diagnosis not present

## 2018-05-28 DIAGNOSIS — M25632 Stiffness of left wrist, not elsewhere classified: Secondary | ICD-10-CM | POA: Diagnosis not present

## 2018-05-31 DIAGNOSIS — Z4802 Encounter for removal of sutures: Secondary | ICD-10-CM | POA: Diagnosis not present

## 2018-06-03 DIAGNOSIS — M25632 Stiffness of left wrist, not elsewhere classified: Secondary | ICD-10-CM | POA: Diagnosis not present

## 2018-06-07 DIAGNOSIS — M25632 Stiffness of left wrist, not elsewhere classified: Secondary | ICD-10-CM | POA: Diagnosis not present

## 2018-06-10 DIAGNOSIS — Z5189 Encounter for other specified aftercare: Secondary | ICD-10-CM | POA: Diagnosis not present

## 2018-06-10 DIAGNOSIS — S52572D Other intraarticular fracture of lower end of left radius, subsequent encounter for closed fracture with routine healing: Secondary | ICD-10-CM | POA: Diagnosis not present

## 2018-06-14 DIAGNOSIS — M25632 Stiffness of left wrist, not elsewhere classified: Secondary | ICD-10-CM | POA: Diagnosis not present

## 2018-06-21 DIAGNOSIS — M25632 Stiffness of left wrist, not elsewhere classified: Secondary | ICD-10-CM | POA: Diagnosis not present

## 2018-06-28 DIAGNOSIS — M25632 Stiffness of left wrist, not elsewhere classified: Secondary | ICD-10-CM | POA: Diagnosis not present

## 2018-07-08 DIAGNOSIS — S52572D Other intraarticular fracture of lower end of left radius, subsequent encounter for closed fracture with routine healing: Secondary | ICD-10-CM | POA: Diagnosis not present

## 2018-07-08 DIAGNOSIS — Z5189 Encounter for other specified aftercare: Secondary | ICD-10-CM | POA: Diagnosis not present

## 2018-07-19 DIAGNOSIS — E7849 Other hyperlipidemia: Secondary | ICD-10-CM | POA: Diagnosis not present

## 2018-07-19 DIAGNOSIS — M1990 Unspecified osteoarthritis, unspecified site: Secondary | ICD-10-CM | POA: Diagnosis not present

## 2018-07-19 DIAGNOSIS — M170 Bilateral primary osteoarthritis of knee: Secondary | ICD-10-CM | POA: Diagnosis not present

## 2018-07-19 DIAGNOSIS — H9319 Tinnitus, unspecified ear: Secondary | ICD-10-CM | POA: Diagnosis not present

## 2018-07-19 DIAGNOSIS — D320 Benign neoplasm of cerebral meninges: Secondary | ICD-10-CM | POA: Diagnosis not present

## 2018-10-12 DIAGNOSIS — D329 Benign neoplasm of meninges, unspecified: Secondary | ICD-10-CM | POA: Diagnosis not present

## 2018-10-12 DIAGNOSIS — D320 Benign neoplasm of cerebral meninges: Secondary | ICD-10-CM | POA: Diagnosis not present

## 2018-10-14 DIAGNOSIS — S52572D Other intraarticular fracture of lower end of left radius, subsequent encounter for closed fracture with routine healing: Secondary | ICD-10-CM | POA: Diagnosis not present

## 2018-10-14 DIAGNOSIS — Z5189 Encounter for other specified aftercare: Secondary | ICD-10-CM | POA: Diagnosis not present

## 2018-10-14 DIAGNOSIS — M65342 Trigger finger, left ring finger: Secondary | ICD-10-CM | POA: Diagnosis not present

## 2019-01-05 DIAGNOSIS — H43813 Vitreous degeneration, bilateral: Secondary | ICD-10-CM | POA: Diagnosis not present

## 2019-01-05 DIAGNOSIS — H25811 Combined forms of age-related cataract, right eye: Secondary | ICD-10-CM | POA: Diagnosis not present

## 2019-01-05 DIAGNOSIS — H353131 Nonexudative age-related macular degeneration, bilateral, early dry stage: Secondary | ICD-10-CM | POA: Diagnosis not present

## 2019-01-05 DIAGNOSIS — H52203 Unspecified astigmatism, bilateral: Secondary | ICD-10-CM | POA: Diagnosis not present

## 2019-01-24 DIAGNOSIS — E7849 Other hyperlipidemia: Secondary | ICD-10-CM | POA: Diagnosis not present

## 2019-01-24 DIAGNOSIS — M859 Disorder of bone density and structure, unspecified: Secondary | ICD-10-CM | POA: Diagnosis not present

## 2019-01-26 DIAGNOSIS — R82998 Other abnormal findings in urine: Secondary | ICD-10-CM | POA: Diagnosis not present

## 2019-01-31 DIAGNOSIS — M25532 Pain in left wrist: Secondary | ICD-10-CM | POA: Diagnosis not present

## 2019-01-31 DIAGNOSIS — M8580 Other specified disorders of bone density and structure, unspecified site: Secondary | ICD-10-CM | POA: Diagnosis not present

## 2019-01-31 DIAGNOSIS — G4700 Insomnia, unspecified: Secondary | ICD-10-CM | POA: Diagnosis not present

## 2019-01-31 DIAGNOSIS — G4739 Other sleep apnea: Secondary | ICD-10-CM | POA: Diagnosis not present

## 2019-01-31 DIAGNOSIS — M1990 Unspecified osteoarthritis, unspecified site: Secondary | ICD-10-CM | POA: Diagnosis not present

## 2019-01-31 DIAGNOSIS — I447 Left bundle-branch block, unspecified: Secondary | ICD-10-CM | POA: Diagnosis not present

## 2019-01-31 DIAGNOSIS — E785 Hyperlipidemia, unspecified: Secondary | ICD-10-CM | POA: Diagnosis not present

## 2019-01-31 DIAGNOSIS — D320 Benign neoplasm of cerebral meninges: Secondary | ICD-10-CM | POA: Diagnosis not present

## 2019-01-31 DIAGNOSIS — H269 Unspecified cataract: Secondary | ICD-10-CM | POA: Diagnosis not present

## 2019-01-31 DIAGNOSIS — Z0000 Encounter for general adult medical examination without abnormal findings: Secondary | ICD-10-CM | POA: Diagnosis not present

## 2019-01-31 DIAGNOSIS — Z1331 Encounter for screening for depression: Secondary | ICD-10-CM | POA: Diagnosis not present

## 2019-01-31 DIAGNOSIS — E669 Obesity, unspecified: Secondary | ICD-10-CM | POA: Diagnosis not present

## 2019-01-31 DIAGNOSIS — M170 Bilateral primary osteoarthritis of knee: Secondary | ICD-10-CM | POA: Diagnosis not present

## 2019-01-31 DIAGNOSIS — Z1339 Encounter for screening examination for other mental health and behavioral disorders: Secondary | ICD-10-CM | POA: Diagnosis not present

## 2019-02-02 DIAGNOSIS — D2271 Melanocytic nevi of right lower limb, including hip: Secondary | ICD-10-CM | POA: Diagnosis not present

## 2019-02-02 DIAGNOSIS — L720 Epidermal cyst: Secondary | ICD-10-CM | POA: Diagnosis not present

## 2019-02-02 DIAGNOSIS — D2272 Melanocytic nevi of left lower limb, including hip: Secondary | ICD-10-CM | POA: Diagnosis not present

## 2019-02-02 DIAGNOSIS — L603 Nail dystrophy: Secondary | ICD-10-CM | POA: Diagnosis not present

## 2019-02-02 DIAGNOSIS — Z85828 Personal history of other malignant neoplasm of skin: Secondary | ICD-10-CM | POA: Diagnosis not present

## 2019-02-02 DIAGNOSIS — L570 Actinic keratosis: Secondary | ICD-10-CM | POA: Diagnosis not present

## 2019-02-02 DIAGNOSIS — L650 Telogen effluvium: Secondary | ICD-10-CM | POA: Diagnosis not present

## 2019-02-02 DIAGNOSIS — L821 Other seborrheic keratosis: Secondary | ICD-10-CM | POA: Diagnosis not present

## 2019-03-03 DIAGNOSIS — H903 Sensorineural hearing loss, bilateral: Secondary | ICD-10-CM | POA: Diagnosis not present

## 2019-04-08 DIAGNOSIS — M65342 Trigger finger, left ring finger: Secondary | ICD-10-CM | POA: Diagnosis not present

## 2019-04-08 DIAGNOSIS — S52572D Other intraarticular fracture of lower end of left radius, subsequent encounter for closed fracture with routine healing: Secondary | ICD-10-CM | POA: Diagnosis not present

## 2019-05-05 DIAGNOSIS — Z85828 Personal history of other malignant neoplasm of skin: Secondary | ICD-10-CM | POA: Diagnosis not present

## 2019-05-05 DIAGNOSIS — D2272 Melanocytic nevi of left lower limb, including hip: Secondary | ICD-10-CM | POA: Diagnosis not present

## 2019-05-05 DIAGNOSIS — D2362 Other benign neoplasm of skin of left upper limb, including shoulder: Secondary | ICD-10-CM | POA: Diagnosis not present

## 2019-05-05 DIAGNOSIS — L821 Other seborrheic keratosis: Secondary | ICD-10-CM | POA: Diagnosis not present

## 2019-05-05 DIAGNOSIS — D225 Melanocytic nevi of trunk: Secondary | ICD-10-CM | POA: Diagnosis not present

## 2019-05-05 DIAGNOSIS — D2271 Melanocytic nevi of right lower limb, including hip: Secondary | ICD-10-CM | POA: Diagnosis not present

## 2019-05-05 DIAGNOSIS — D1801 Hemangioma of skin and subcutaneous tissue: Secondary | ICD-10-CM | POA: Diagnosis not present

## 2019-05-21 DIAGNOSIS — Z23 Encounter for immunization: Secondary | ICD-10-CM | POA: Diagnosis not present

## 2019-08-30 ENCOUNTER — Telehealth: Payer: Self-pay | Admitting: Allergy

## 2019-08-30 NOTE — Telephone Encounter (Signed)
Anne please advise. I see where the passed a Penicillin challenge last year. Not too sure about where it states for her to not take Penicillin.

## 2019-08-30 NOTE — Telephone Encounter (Signed)
Pt called and was reading her notes on my chart and found that it said that she did not have a allergy penicillin, but wants her to not [place] any penicillin - based antibioties.[phone]

## 2019-08-31 ENCOUNTER — Ambulatory Visit: Payer: Medicare Other | Attending: Internal Medicine

## 2019-08-31 DIAGNOSIS — Z23 Encounter for immunization: Secondary | ICD-10-CM | POA: Diagnosis not present

## 2019-08-31 NOTE — Progress Notes (Signed)
   Covid-19 Vaccination Clinic  Name:  Susan Bruce    MRN: [id number] DOB: 1941-03-14  08/31/2019  [name] was observed post Covid-19 immunization for 15 minutes without incidence. She was provided with Vaccine Information Sheet and instruction to access the V-Safe system.   [name] was instructed to call 911 with any severe reactions post vaccine: Marland Kitchen Difficulty breathing  . Swelling of your face and throat  . A fast heartbeat  . A bad rash all over your body  . Dizziness and weakness    Immunizations Administered    Name Date Dose VIS Date Route   Pfizer COVID-19 Vaccine 08/31/2019 11:46 AM 0.3 mL 07/29/2019 Intramuscular   Manufacturer: [place]   Lot: [id number]   [place]: [id number]

## 2019-08-31 NOTE — Telephone Encounter (Signed)
Can you please call this patient and let her know that I have reviewed her chart including penicillin amounts and vital sighs and she did pass the penicillin test in the clinic. Please let her know there was a mistake in the chart and it has been amended to reflect that she passed the challenge. She should be able to see the change in her chart. Thank you. I have called and left one message- can you please give a call too. Thank you

## 2019-09-01 NOTE — Telephone Encounter (Signed)
Called and informed patient about the change in her chart regarding Penicillin. Patient verbalized understanding.

## 2019-09-21 ENCOUNTER — Ambulatory Visit: Payer: Medicare Other | Attending: Internal Medicine

## 2019-09-21 DIAGNOSIS — Z23 Encounter for immunization: Secondary | ICD-10-CM | POA: Insufficient documentation

## 2019-09-21 NOTE — Progress Notes (Signed)
   Covid-19 Vaccination Clinic  Name:  Susan Bruce    MRN: [id number] DOB: 1940/09/23  09/21/2019  [name] was observed post Covid-19 immunization for 15 minutes without incidence. She was provided with Vaccine Information Sheet and instruction to access the V-Safe system.   [name] was instructed to call 911 with any severe reactions post vaccine: Marland Kitchen Difficulty breathing  . Swelling of your face and throat  . A fast heartbeat  . A bad rash all over your body  . Dizziness and weakness    Immunizations Administered    Name Date Dose VIS Date Route   Pfizer COVID-19 Vaccine 09/21/2019 12:03 PM 0.3 mL 07/29/2019 Intramuscular   Manufacturer: [place]   Lot: [id number]   [place]: [id number]

## 2019-10-10 DIAGNOSIS — H25811 Combined forms of age-related cataract, right eye: Secondary | ICD-10-CM | POA: Diagnosis not present

## 2019-10-10 DIAGNOSIS — H353131 Nonexudative age-related macular degeneration, bilateral, early dry stage: Secondary | ICD-10-CM | POA: Diagnosis not present

## 2019-10-10 DIAGNOSIS — H04123 Dry eye syndrome of bilateral lacrimal glands: Secondary | ICD-10-CM | POA: Diagnosis not present

## 2019-10-10 DIAGNOSIS — H5201 Hypermetropia, right eye: Secondary | ICD-10-CM | POA: Diagnosis not present

## 2019-10-31 DIAGNOSIS — H25811 Combined forms of age-related cataract, right eye: Secondary | ICD-10-CM | POA: Diagnosis not present

## 2019-10-31 DIAGNOSIS — H353131 Nonexudative age-related macular degeneration, bilateral, early dry stage: Secondary | ICD-10-CM | POA: Diagnosis not present

## 2019-11-17 IMAGING — US US EXTREM LOW VENOUS*L*
1 series · 13 of 24 positions shown · non-contrast
Comparison: None.

CLINICAL DATA: Bruising, knee injury, left leg pain and edema



[Series 1: us extrem low venous*left* · 0.08mm/px · 13 of 31 slices shown]
[im 1/31]
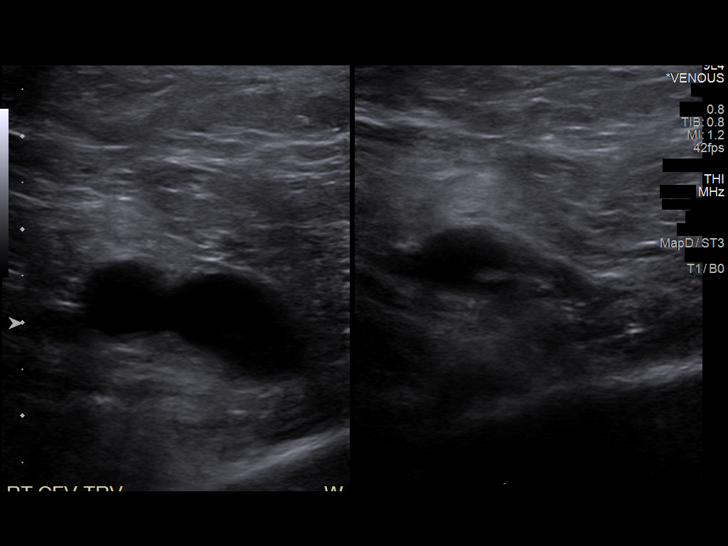
[im 3/31]
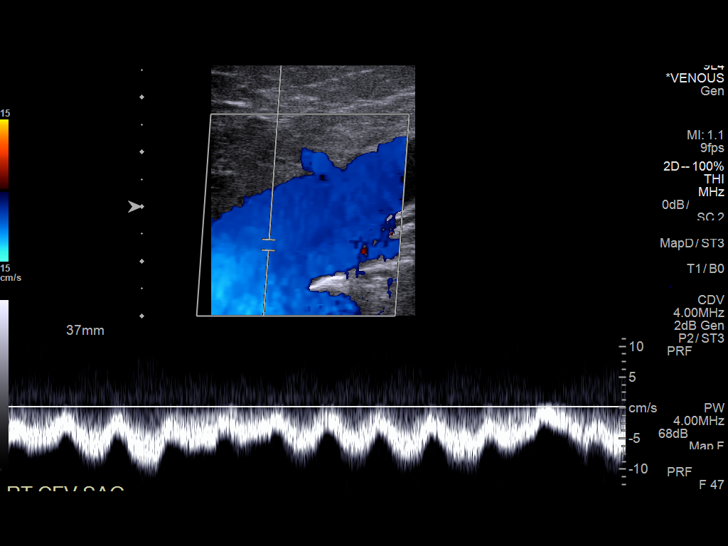
[im 6/31]
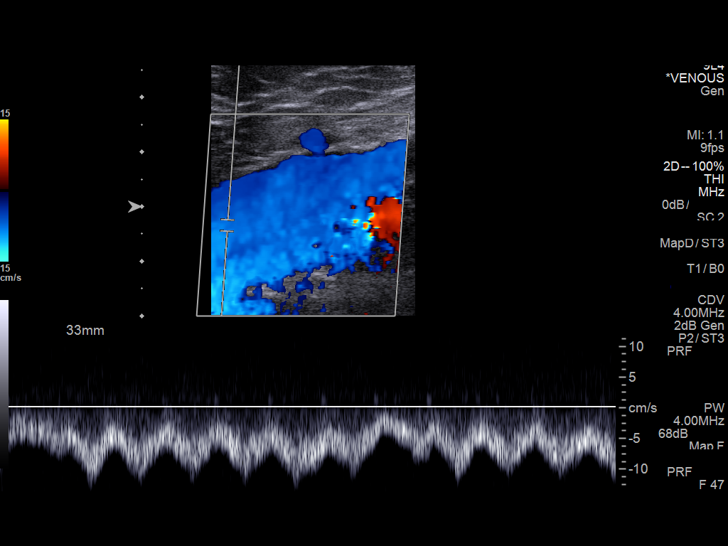
[im 8/31]
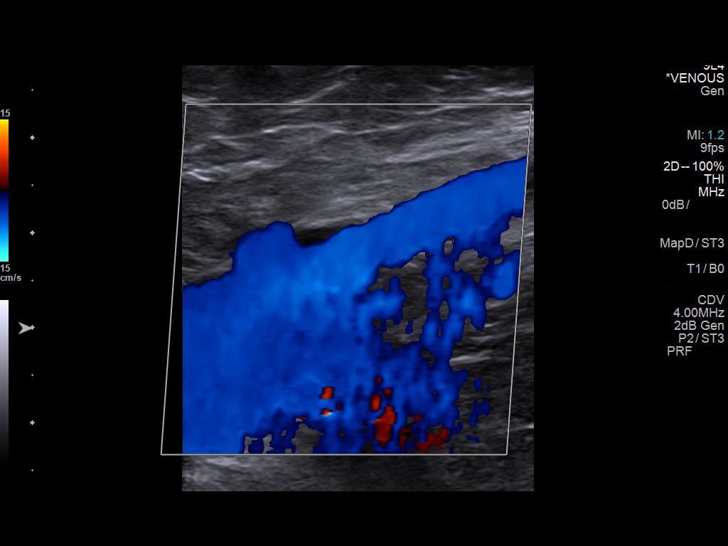
[im 11/31]
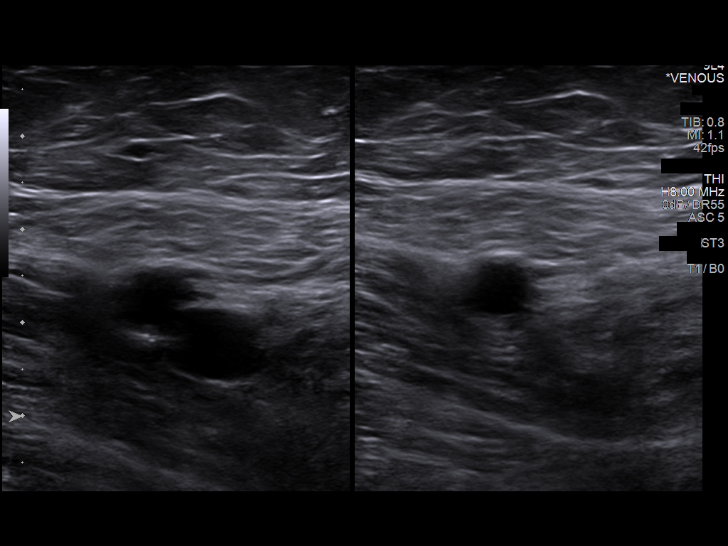
[im 14/31]
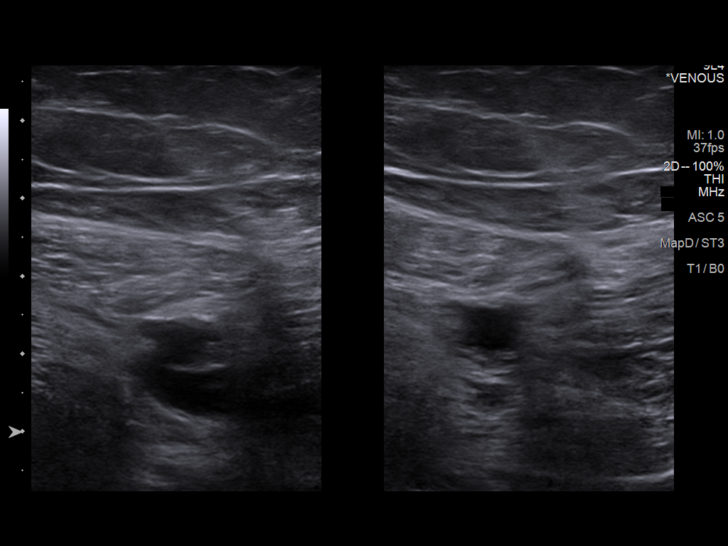
[im 16/31]
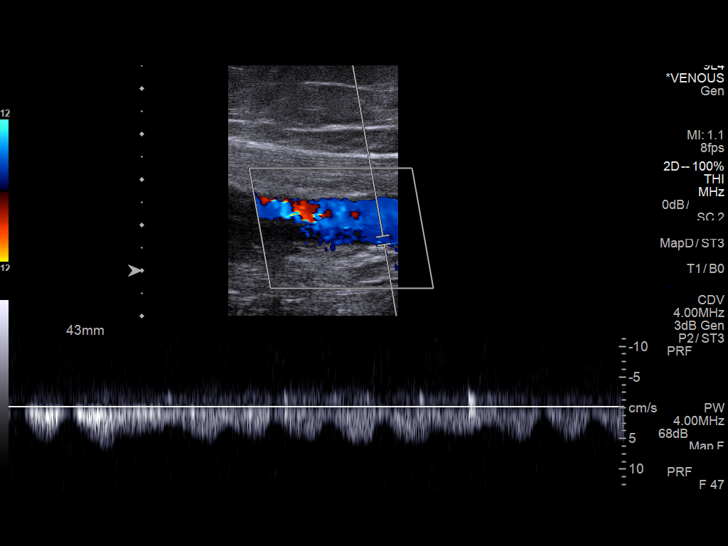
[im 17/31]
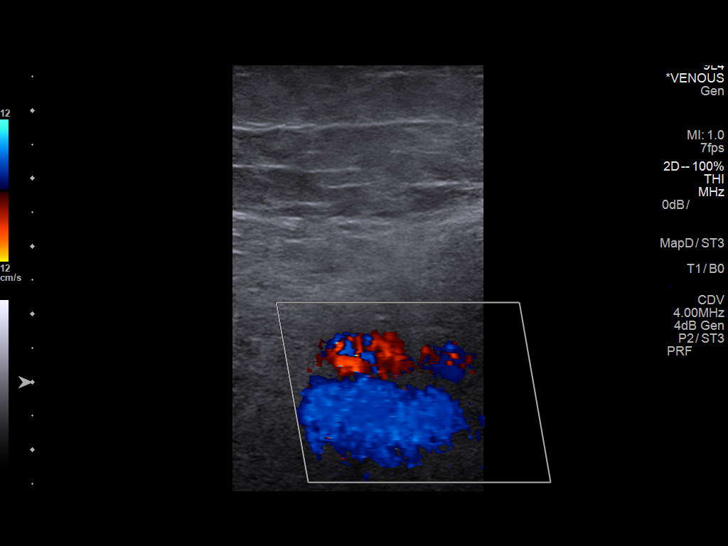
[im 20/31]
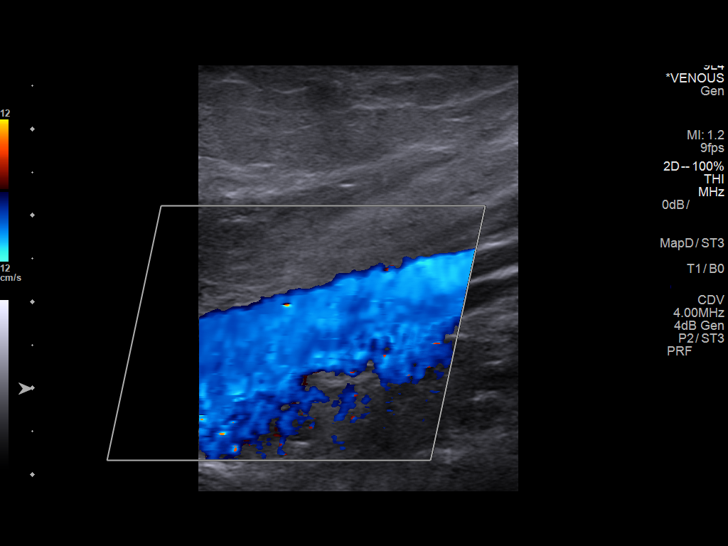
[im 23/31]
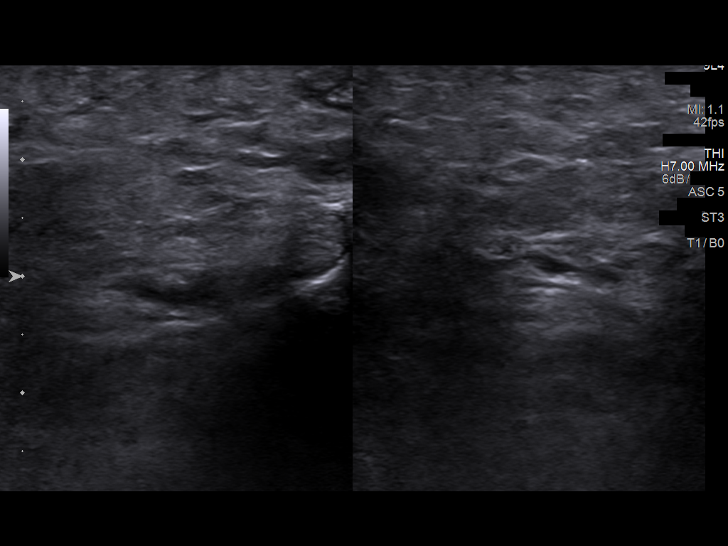
[im 25/31]
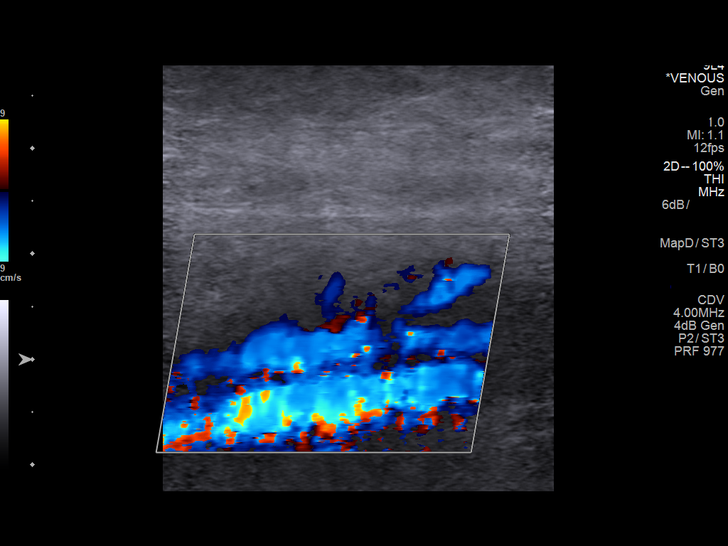
[im 28/31]
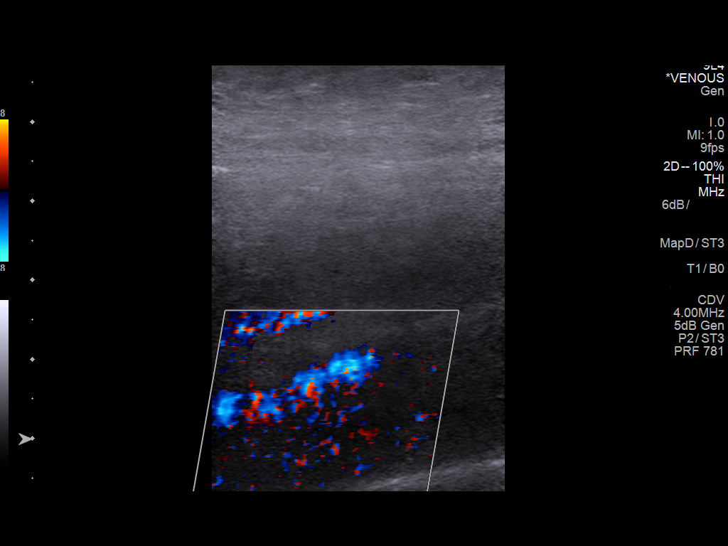
[im 31/31]
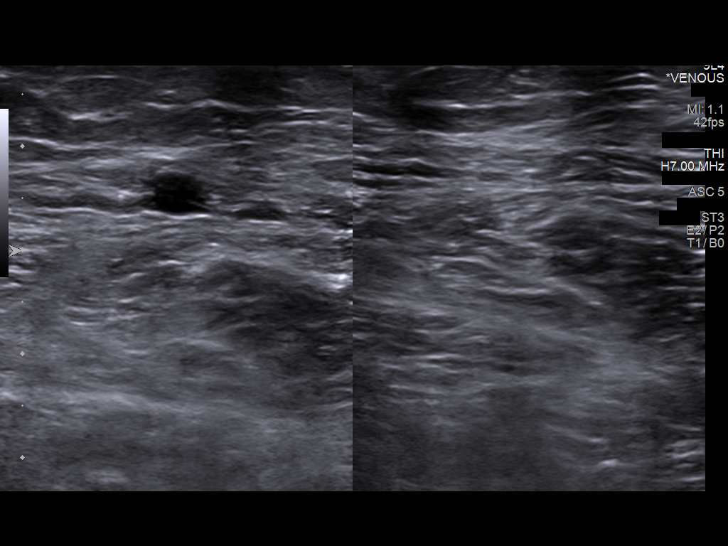

[13 of 24 positions shown; findings below may reference images not displayed]

FINDINGS: Contralateral Common Femoral Vein: Respiratory phasicity is normal
and symmetric with the symptomatic side. No evidence of thrombus.
Normal compressibility.

Common Femoral Vein: No evidence of thrombus. Normal
compressibility, respiratory phasicity and response to augmentation.

Saphenofemoral Junction: No evidence of thrombus. Normal
compressibility and flow on color Doppler imaging.

Profunda Femoral Vein: No evidence of thrombus. Normal
compressibility and flow on color Doppler imaging.

Femoral Vein: No evidence of thrombus. Normal compressibility,
respiratory phasicity and response to augmentation.

Popliteal Vein: No evidence of thrombus. Normal compressibility,
respiratory phasicity and response to augmentation.

Calf Veins: No evidence of thrombus. Normal compressibility and flow
on color Doppler imaging.

Superficial Great Saphenous Vein: No evidence of thrombus. Normal
compressibility.

Venous Reflux:  None.

Other Findings:  None.
IMPRESSION: No evidence of deep venous thrombosis.

## 2019-12-08 DIAGNOSIS — M6281 Muscle weakness (generalized): Secondary | ICD-10-CM | POA: Diagnosis not present

## 2019-12-08 DIAGNOSIS — M79662 Pain in left lower leg: Secondary | ICD-10-CM | POA: Diagnosis not present

## 2019-12-08 DIAGNOSIS — M25562 Pain in left knee: Secondary | ICD-10-CM | POA: Diagnosis not present

## 2019-12-08 DIAGNOSIS — M25561 Pain in right knee: Secondary | ICD-10-CM | POA: Diagnosis not present

## 2019-12-08 DIAGNOSIS — M79661 Pain in right lower leg: Secondary | ICD-10-CM | POA: Diagnosis not present

## 2019-12-08 DIAGNOSIS — M25462 Effusion, left knee: Secondary | ICD-10-CM | POA: Diagnosis not present

## 2019-12-08 DIAGNOSIS — R262 Difficulty in walking, not elsewhere classified: Secondary | ICD-10-CM | POA: Diagnosis not present

## 2019-12-08 DIAGNOSIS — M25461 Effusion, right knee: Secondary | ICD-10-CM | POA: Diagnosis not present

## 2019-12-12 DIAGNOSIS — M25562 Pain in left knee: Secondary | ICD-10-CM | POA: Diagnosis not present

## 2019-12-12 DIAGNOSIS — M79661 Pain in right lower leg: Secondary | ICD-10-CM | POA: Diagnosis not present

## 2019-12-12 DIAGNOSIS — M25461 Effusion, right knee: Secondary | ICD-10-CM | POA: Diagnosis not present

## 2019-12-12 DIAGNOSIS — M25561 Pain in right knee: Secondary | ICD-10-CM | POA: Diagnosis not present

## 2019-12-12 DIAGNOSIS — M79662 Pain in left lower leg: Secondary | ICD-10-CM | POA: Diagnosis not present

## 2019-12-12 DIAGNOSIS — M6281 Muscle weakness (generalized): Secondary | ICD-10-CM | POA: Diagnosis not present

## 2019-12-12 DIAGNOSIS — M25462 Effusion, left knee: Secondary | ICD-10-CM | POA: Diagnosis not present

## 2019-12-12 DIAGNOSIS — R262 Difficulty in walking, not elsewhere classified: Secondary | ICD-10-CM | POA: Diagnosis not present

## 2019-12-14 DIAGNOSIS — M25561 Pain in right knee: Secondary | ICD-10-CM | POA: Diagnosis not present

## 2019-12-14 DIAGNOSIS — M6281 Muscle weakness (generalized): Secondary | ICD-10-CM | POA: Diagnosis not present

## 2019-12-14 DIAGNOSIS — M79661 Pain in right lower leg: Secondary | ICD-10-CM | POA: Diagnosis not present

## 2019-12-14 DIAGNOSIS — M25461 Effusion, right knee: Secondary | ICD-10-CM | POA: Diagnosis not present

## 2019-12-14 DIAGNOSIS — M25562 Pain in left knee: Secondary | ICD-10-CM | POA: Diagnosis not present

## 2019-12-14 DIAGNOSIS — M79662 Pain in left lower leg: Secondary | ICD-10-CM | POA: Diagnosis not present

## 2019-12-14 DIAGNOSIS — M25462 Effusion, left knee: Secondary | ICD-10-CM | POA: Diagnosis not present

## 2019-12-14 DIAGNOSIS — R262 Difficulty in walking, not elsewhere classified: Secondary | ICD-10-CM | POA: Diagnosis not present

## 2019-12-16 DIAGNOSIS — M25461 Effusion, right knee: Secondary | ICD-10-CM | POA: Diagnosis not present

## 2019-12-16 DIAGNOSIS — R262 Difficulty in walking, not elsewhere classified: Secondary | ICD-10-CM | POA: Diagnosis not present

## 2019-12-16 DIAGNOSIS — M25561 Pain in right knee: Secondary | ICD-10-CM | POA: Diagnosis not present

## 2019-12-16 DIAGNOSIS — M79661 Pain in right lower leg: Secondary | ICD-10-CM | POA: Diagnosis not present

## 2019-12-16 DIAGNOSIS — M25562 Pain in left knee: Secondary | ICD-10-CM | POA: Diagnosis not present

## 2019-12-16 DIAGNOSIS — M6281 Muscle weakness (generalized): Secondary | ICD-10-CM | POA: Diagnosis not present

## 2019-12-16 DIAGNOSIS — M79662 Pain in left lower leg: Secondary | ICD-10-CM | POA: Diagnosis not present

## 2019-12-16 DIAGNOSIS — M25462 Effusion, left knee: Secondary | ICD-10-CM | POA: Diagnosis not present

## 2019-12-19 DIAGNOSIS — M25561 Pain in right knee: Secondary | ICD-10-CM | POA: Diagnosis not present

## 2019-12-19 DIAGNOSIS — M79661 Pain in right lower leg: Secondary | ICD-10-CM | POA: Diagnosis not present

## 2019-12-19 DIAGNOSIS — M6281 Muscle weakness (generalized): Secondary | ICD-10-CM | POA: Diagnosis not present

## 2019-12-19 DIAGNOSIS — M25461 Effusion, right knee: Secondary | ICD-10-CM | POA: Diagnosis not present

## 2019-12-19 DIAGNOSIS — R262 Difficulty in walking, not elsewhere classified: Secondary | ICD-10-CM | POA: Diagnosis not present

## 2019-12-19 DIAGNOSIS — M25462 Effusion, left knee: Secondary | ICD-10-CM | POA: Diagnosis not present

## 2019-12-19 DIAGNOSIS — M25562 Pain in left knee: Secondary | ICD-10-CM | POA: Diagnosis not present

## 2019-12-19 DIAGNOSIS — M79662 Pain in left lower leg: Secondary | ICD-10-CM | POA: Diagnosis not present

## 2019-12-20 DIAGNOSIS — M25562 Pain in left knee: Secondary | ICD-10-CM | POA: Diagnosis not present

## 2019-12-20 DIAGNOSIS — M25461 Effusion, right knee: Secondary | ICD-10-CM | POA: Diagnosis not present

## 2019-12-20 DIAGNOSIS — M79661 Pain in right lower leg: Secondary | ICD-10-CM | POA: Diagnosis not present

## 2019-12-20 DIAGNOSIS — M25462 Effusion, left knee: Secondary | ICD-10-CM | POA: Diagnosis not present

## 2019-12-20 DIAGNOSIS — M6281 Muscle weakness (generalized): Secondary | ICD-10-CM | POA: Diagnosis not present

## 2019-12-20 DIAGNOSIS — M25561 Pain in right knee: Secondary | ICD-10-CM | POA: Diagnosis not present

## 2019-12-20 DIAGNOSIS — M79662 Pain in left lower leg: Secondary | ICD-10-CM | POA: Diagnosis not present

## 2019-12-20 DIAGNOSIS — R262 Difficulty in walking, not elsewhere classified: Secondary | ICD-10-CM | POA: Diagnosis not present

## 2019-12-22 DIAGNOSIS — M25462 Effusion, left knee: Secondary | ICD-10-CM | POA: Diagnosis not present

## 2019-12-22 DIAGNOSIS — R262 Difficulty in walking, not elsewhere classified: Secondary | ICD-10-CM | POA: Diagnosis not present

## 2019-12-22 DIAGNOSIS — M25561 Pain in right knee: Secondary | ICD-10-CM | POA: Diagnosis not present

## 2019-12-22 DIAGNOSIS — M79661 Pain in right lower leg: Secondary | ICD-10-CM | POA: Diagnosis not present

## 2019-12-22 DIAGNOSIS — M25562 Pain in left knee: Secondary | ICD-10-CM | POA: Diagnosis not present

## 2019-12-22 DIAGNOSIS — M79662 Pain in left lower leg: Secondary | ICD-10-CM | POA: Diagnosis not present

## 2019-12-22 DIAGNOSIS — M6281 Muscle weakness (generalized): Secondary | ICD-10-CM | POA: Diagnosis not present

## 2019-12-22 DIAGNOSIS — M25461 Effusion, right knee: Secondary | ICD-10-CM | POA: Diagnosis not present

## 2019-12-26 DIAGNOSIS — M25562 Pain in left knee: Secondary | ICD-10-CM | POA: Diagnosis not present

## 2019-12-26 DIAGNOSIS — R262 Difficulty in walking, not elsewhere classified: Secondary | ICD-10-CM | POA: Diagnosis not present

## 2019-12-26 DIAGNOSIS — M25561 Pain in right knee: Secondary | ICD-10-CM | POA: Diagnosis not present

## 2019-12-26 DIAGNOSIS — M79662 Pain in left lower leg: Secondary | ICD-10-CM | POA: Diagnosis not present

## 2019-12-26 DIAGNOSIS — M25461 Effusion, right knee: Secondary | ICD-10-CM | POA: Diagnosis not present

## 2019-12-26 DIAGNOSIS — M6281 Muscle weakness (generalized): Secondary | ICD-10-CM | POA: Diagnosis not present

## 2019-12-26 DIAGNOSIS — M79661 Pain in right lower leg: Secondary | ICD-10-CM | POA: Diagnosis not present

## 2019-12-26 DIAGNOSIS — M25462 Effusion, left knee: Secondary | ICD-10-CM | POA: Diagnosis not present

## 2019-12-29 DIAGNOSIS — M25561 Pain in right knee: Secondary | ICD-10-CM | POA: Diagnosis not present

## 2019-12-29 DIAGNOSIS — M25562 Pain in left knee: Secondary | ICD-10-CM | POA: Diagnosis not present

## 2019-12-29 DIAGNOSIS — R262 Difficulty in walking, not elsewhere classified: Secondary | ICD-10-CM | POA: Diagnosis not present

## 2019-12-29 DIAGNOSIS — M25462 Effusion, left knee: Secondary | ICD-10-CM | POA: Diagnosis not present

## 2019-12-29 DIAGNOSIS — M25461 Effusion, right knee: Secondary | ICD-10-CM | POA: Diagnosis not present

## 2019-12-29 DIAGNOSIS — M6281 Muscle weakness (generalized): Secondary | ICD-10-CM | POA: Diagnosis not present

## 2019-12-29 DIAGNOSIS — M79662 Pain in left lower leg: Secondary | ICD-10-CM | POA: Diagnosis not present

## 2019-12-29 DIAGNOSIS — M79661 Pain in right lower leg: Secondary | ICD-10-CM | POA: Diagnosis not present

## 2019-12-30 DIAGNOSIS — M25561 Pain in right knee: Secondary | ICD-10-CM | POA: Diagnosis not present

## 2019-12-30 DIAGNOSIS — M25461 Effusion, right knee: Secondary | ICD-10-CM | POA: Diagnosis not present

## 2019-12-30 DIAGNOSIS — R262 Difficulty in walking, not elsewhere classified: Secondary | ICD-10-CM | POA: Diagnosis not present

## 2019-12-30 DIAGNOSIS — M6281 Muscle weakness (generalized): Secondary | ICD-10-CM | POA: Diagnosis not present

## 2019-12-30 DIAGNOSIS — M79662 Pain in left lower leg: Secondary | ICD-10-CM | POA: Diagnosis not present

## 2019-12-30 DIAGNOSIS — M25562 Pain in left knee: Secondary | ICD-10-CM | POA: Diagnosis not present

## 2019-12-30 DIAGNOSIS — M25462 Effusion, left knee: Secondary | ICD-10-CM | POA: Diagnosis not present

## 2019-12-30 DIAGNOSIS — M79661 Pain in right lower leg: Secondary | ICD-10-CM | POA: Diagnosis not present

## 2020-01-02 DIAGNOSIS — M25561 Pain in right knee: Secondary | ICD-10-CM | POA: Diagnosis not present

## 2020-01-02 DIAGNOSIS — M79661 Pain in right lower leg: Secondary | ICD-10-CM | POA: Diagnosis not present

## 2020-01-02 DIAGNOSIS — M25461 Effusion, right knee: Secondary | ICD-10-CM | POA: Diagnosis not present

## 2020-01-02 DIAGNOSIS — M25462 Effusion, left knee: Secondary | ICD-10-CM | POA: Diagnosis not present

## 2020-01-02 DIAGNOSIS — M6281 Muscle weakness (generalized): Secondary | ICD-10-CM | POA: Diagnosis not present

## 2020-01-02 DIAGNOSIS — R262 Difficulty in walking, not elsewhere classified: Secondary | ICD-10-CM | POA: Diagnosis not present

## 2020-01-02 DIAGNOSIS — M25562 Pain in left knee: Secondary | ICD-10-CM | POA: Diagnosis not present

## 2020-01-02 DIAGNOSIS — M79662 Pain in left lower leg: Secondary | ICD-10-CM | POA: Diagnosis not present

## 2020-01-04 DIAGNOSIS — M79662 Pain in left lower leg: Secondary | ICD-10-CM | POA: Diagnosis not present

## 2020-01-04 DIAGNOSIS — M25462 Effusion, left knee: Secondary | ICD-10-CM | POA: Diagnosis not present

## 2020-01-04 DIAGNOSIS — M79661 Pain in right lower leg: Secondary | ICD-10-CM | POA: Diagnosis not present

## 2020-01-04 DIAGNOSIS — M25562 Pain in left knee: Secondary | ICD-10-CM | POA: Diagnosis not present

## 2020-01-04 DIAGNOSIS — R262 Difficulty in walking, not elsewhere classified: Secondary | ICD-10-CM | POA: Diagnosis not present

## 2020-01-04 DIAGNOSIS — M25561 Pain in right knee: Secondary | ICD-10-CM | POA: Diagnosis not present

## 2020-01-04 DIAGNOSIS — M6281 Muscle weakness (generalized): Secondary | ICD-10-CM | POA: Diagnosis not present

## 2020-01-04 DIAGNOSIS — M25461 Effusion, right knee: Secondary | ICD-10-CM | POA: Diagnosis not present

## 2020-01-06 DIAGNOSIS — M25562 Pain in left knee: Secondary | ICD-10-CM | POA: Diagnosis not present

## 2020-01-06 DIAGNOSIS — M6281 Muscle weakness (generalized): Secondary | ICD-10-CM | POA: Diagnosis not present

## 2020-01-06 DIAGNOSIS — M79662 Pain in left lower leg: Secondary | ICD-10-CM | POA: Diagnosis not present

## 2020-01-06 DIAGNOSIS — M79661 Pain in right lower leg: Secondary | ICD-10-CM | POA: Diagnosis not present

## 2020-01-06 DIAGNOSIS — M25461 Effusion, right knee: Secondary | ICD-10-CM | POA: Diagnosis not present

## 2020-01-06 DIAGNOSIS — M25462 Effusion, left knee: Secondary | ICD-10-CM | POA: Diagnosis not present

## 2020-01-06 DIAGNOSIS — M25561 Pain in right knee: Secondary | ICD-10-CM | POA: Diagnosis not present

## 2020-01-06 DIAGNOSIS — R262 Difficulty in walking, not elsewhere classified: Secondary | ICD-10-CM | POA: Diagnosis not present

## 2020-01-09 DIAGNOSIS — M79662 Pain in left lower leg: Secondary | ICD-10-CM | POA: Diagnosis not present

## 2020-01-09 DIAGNOSIS — M6281 Muscle weakness (generalized): Secondary | ICD-10-CM | POA: Diagnosis not present

## 2020-01-09 DIAGNOSIS — M25461 Effusion, right knee: Secondary | ICD-10-CM | POA: Diagnosis not present

## 2020-01-09 DIAGNOSIS — M25561 Pain in right knee: Secondary | ICD-10-CM | POA: Diagnosis not present

## 2020-01-09 DIAGNOSIS — M25562 Pain in left knee: Secondary | ICD-10-CM | POA: Diagnosis not present

## 2020-01-09 DIAGNOSIS — M25462 Effusion, left knee: Secondary | ICD-10-CM | POA: Diagnosis not present

## 2020-01-09 DIAGNOSIS — R262 Difficulty in walking, not elsewhere classified: Secondary | ICD-10-CM | POA: Diagnosis not present

## 2020-01-09 DIAGNOSIS — M79661 Pain in right lower leg: Secondary | ICD-10-CM | POA: Diagnosis not present

## 2020-01-13 DIAGNOSIS — M25461 Effusion, right knee: Secondary | ICD-10-CM | POA: Diagnosis not present

## 2020-01-13 DIAGNOSIS — M25562 Pain in left knee: Secondary | ICD-10-CM | POA: Diagnosis not present

## 2020-01-13 DIAGNOSIS — M25561 Pain in right knee: Secondary | ICD-10-CM | POA: Diagnosis not present

## 2020-01-13 DIAGNOSIS — M79661 Pain in right lower leg: Secondary | ICD-10-CM | POA: Diagnosis not present

## 2020-01-13 DIAGNOSIS — M79662 Pain in left lower leg: Secondary | ICD-10-CM | POA: Diagnosis not present

## 2020-01-13 DIAGNOSIS — M6281 Muscle weakness (generalized): Secondary | ICD-10-CM | POA: Diagnosis not present

## 2020-01-13 DIAGNOSIS — M25462 Effusion, left knee: Secondary | ICD-10-CM | POA: Diagnosis not present

## 2020-01-13 DIAGNOSIS — R262 Difficulty in walking, not elsewhere classified: Secondary | ICD-10-CM | POA: Diagnosis not present

## 2020-01-17 DIAGNOSIS — M25462 Effusion, left knee: Secondary | ICD-10-CM | POA: Diagnosis not present

## 2020-01-17 DIAGNOSIS — M79661 Pain in right lower leg: Secondary | ICD-10-CM | POA: Diagnosis not present

## 2020-01-17 DIAGNOSIS — M25561 Pain in right knee: Secondary | ICD-10-CM | POA: Diagnosis not present

## 2020-01-17 DIAGNOSIS — M6281 Muscle weakness (generalized): Secondary | ICD-10-CM | POA: Diagnosis not present

## 2020-01-17 DIAGNOSIS — R262 Difficulty in walking, not elsewhere classified: Secondary | ICD-10-CM | POA: Diagnosis not present

## 2020-01-17 DIAGNOSIS — M25461 Effusion, right knee: Secondary | ICD-10-CM | POA: Diagnosis not present

## 2020-01-17 DIAGNOSIS — M25562 Pain in left knee: Secondary | ICD-10-CM | POA: Diagnosis not present

## 2020-01-17 DIAGNOSIS — M79662 Pain in left lower leg: Secondary | ICD-10-CM | POA: Diagnosis not present

## 2020-01-20 DIAGNOSIS — M6281 Muscle weakness (generalized): Secondary | ICD-10-CM | POA: Diagnosis not present

## 2020-01-20 DIAGNOSIS — M25561 Pain in right knee: Secondary | ICD-10-CM | POA: Diagnosis not present

## 2020-01-20 DIAGNOSIS — M25562 Pain in left knee: Secondary | ICD-10-CM | POA: Diagnosis not present

## 2020-01-20 DIAGNOSIS — M79661 Pain in right lower leg: Secondary | ICD-10-CM | POA: Diagnosis not present

## 2020-01-20 DIAGNOSIS — M79662 Pain in left lower leg: Secondary | ICD-10-CM | POA: Diagnosis not present

## 2020-01-20 DIAGNOSIS — M25461 Effusion, right knee: Secondary | ICD-10-CM | POA: Diagnosis not present

## 2020-01-20 DIAGNOSIS — R262 Difficulty in walking, not elsewhere classified: Secondary | ICD-10-CM | POA: Diagnosis not present

## 2020-01-20 DIAGNOSIS — M25462 Effusion, left knee: Secondary | ICD-10-CM | POA: Diagnosis not present

## 2020-01-24 DIAGNOSIS — M25461 Effusion, right knee: Secondary | ICD-10-CM | POA: Diagnosis not present

## 2020-01-24 DIAGNOSIS — M25561 Pain in right knee: Secondary | ICD-10-CM | POA: Diagnosis not present

## 2020-01-24 DIAGNOSIS — M79661 Pain in right lower leg: Secondary | ICD-10-CM | POA: Diagnosis not present

## 2020-01-24 DIAGNOSIS — M79662 Pain in left lower leg: Secondary | ICD-10-CM | POA: Diagnosis not present

## 2020-01-24 DIAGNOSIS — M25462 Effusion, left knee: Secondary | ICD-10-CM | POA: Diagnosis not present

## 2020-01-24 DIAGNOSIS — M25562 Pain in left knee: Secondary | ICD-10-CM | POA: Diagnosis not present

## 2020-01-24 DIAGNOSIS — M6281 Muscle weakness (generalized): Secondary | ICD-10-CM | POA: Diagnosis not present

## 2020-01-24 DIAGNOSIS — R262 Difficulty in walking, not elsewhere classified: Secondary | ICD-10-CM | POA: Diagnosis not present

## 2020-01-25 DIAGNOSIS — M859 Disorder of bone density and structure, unspecified: Secondary | ICD-10-CM | POA: Diagnosis not present

## 2020-01-25 DIAGNOSIS — E7849 Other hyperlipidemia: Secondary | ICD-10-CM | POA: Diagnosis not present

## 2020-01-27 DIAGNOSIS — M25562 Pain in left knee: Secondary | ICD-10-CM | POA: Diagnosis not present

## 2020-01-27 DIAGNOSIS — R262 Difficulty in walking, not elsewhere classified: Secondary | ICD-10-CM | POA: Diagnosis not present

## 2020-01-27 DIAGNOSIS — M25561 Pain in right knee: Secondary | ICD-10-CM | POA: Diagnosis not present

## 2020-01-27 DIAGNOSIS — M79661 Pain in right lower leg: Secondary | ICD-10-CM | POA: Diagnosis not present

## 2020-01-27 DIAGNOSIS — M79662 Pain in left lower leg: Secondary | ICD-10-CM | POA: Diagnosis not present

## 2020-01-27 DIAGNOSIS — M25462 Effusion, left knee: Secondary | ICD-10-CM | POA: Diagnosis not present

## 2020-01-27 DIAGNOSIS — M25461 Effusion, right knee: Secondary | ICD-10-CM | POA: Diagnosis not present

## 2020-01-27 DIAGNOSIS — M6281 Muscle weakness (generalized): Secondary | ICD-10-CM | POA: Diagnosis not present

## 2020-02-13 DIAGNOSIS — M25561 Pain in right knee: Secondary | ICD-10-CM | POA: Diagnosis not present

## 2020-02-13 DIAGNOSIS — M25461 Effusion, right knee: Secondary | ICD-10-CM | POA: Diagnosis not present

## 2020-02-13 DIAGNOSIS — M25462 Effusion, left knee: Secondary | ICD-10-CM | POA: Diagnosis not present

## 2020-02-13 DIAGNOSIS — M25562 Pain in left knee: Secondary | ICD-10-CM | POA: Diagnosis not present

## 2020-02-13 DIAGNOSIS — M6281 Muscle weakness (generalized): Secondary | ICD-10-CM | POA: Diagnosis not present

## 2020-02-13 DIAGNOSIS — R262 Difficulty in walking, not elsewhere classified: Secondary | ICD-10-CM | POA: Diagnosis not present

## 2020-02-13 DIAGNOSIS — M79662 Pain in left lower leg: Secondary | ICD-10-CM | POA: Diagnosis not present

## 2020-02-13 DIAGNOSIS — M79661 Pain in right lower leg: Secondary | ICD-10-CM | POA: Diagnosis not present

## 2020-02-16 DIAGNOSIS — M79662 Pain in left lower leg: Secondary | ICD-10-CM | POA: Diagnosis not present

## 2020-02-16 DIAGNOSIS — M25561 Pain in right knee: Secondary | ICD-10-CM | POA: Diagnosis not present

## 2020-02-16 DIAGNOSIS — M25462 Effusion, left knee: Secondary | ICD-10-CM | POA: Diagnosis not present

## 2020-02-16 DIAGNOSIS — M6281 Muscle weakness (generalized): Secondary | ICD-10-CM | POA: Diagnosis not present

## 2020-02-16 DIAGNOSIS — M25461 Effusion, right knee: Secondary | ICD-10-CM | POA: Diagnosis not present

## 2020-02-16 DIAGNOSIS — M79661 Pain in right lower leg: Secondary | ICD-10-CM | POA: Diagnosis not present

## 2020-02-16 DIAGNOSIS — R262 Difficulty in walking, not elsewhere classified: Secondary | ICD-10-CM | POA: Diagnosis not present

## 2020-02-16 DIAGNOSIS — M25562 Pain in left knee: Secondary | ICD-10-CM | POA: Diagnosis not present

## 2020-03-05 DIAGNOSIS — H18593 Other hereditary corneal dystrophies, bilateral: Secondary | ICD-10-CM | POA: Diagnosis not present

## 2020-03-05 DIAGNOSIS — H2511 Age-related nuclear cataract, right eye: Secondary | ICD-10-CM | POA: Diagnosis not present

## 2020-03-05 DIAGNOSIS — H04123 Dry eye syndrome of bilateral lacrimal glands: Secondary | ICD-10-CM | POA: Diagnosis not present

## 2020-04-24 DIAGNOSIS — M65341 Trigger finger, right ring finger: Secondary | ICD-10-CM | POA: Diagnosis not present

## 2020-05-01 DIAGNOSIS — M2550 Pain in unspecified joint: Secondary | ICD-10-CM | POA: Diagnosis not present

## 2020-05-01 DIAGNOSIS — S5292XS Unspecified fracture of left forearm, sequela: Secondary | ICD-10-CM | POA: Diagnosis not present

## 2020-05-01 DIAGNOSIS — D329 Benign neoplasm of meninges, unspecified: Secondary | ICD-10-CM | POA: Diagnosis not present

## 2020-05-01 DIAGNOSIS — D320 Benign neoplasm of cerebral meninges: Secondary | ICD-10-CM | POA: Diagnosis not present

## 2020-05-01 DIAGNOSIS — R2689 Other abnormalities of gait and mobility: Secondary | ICD-10-CM | POA: Diagnosis not present

## 2020-05-01 DIAGNOSIS — H269 Unspecified cataract: Secondary | ICD-10-CM | POA: Diagnosis not present

## 2020-05-07 DIAGNOSIS — L821 Other seborrheic keratosis: Secondary | ICD-10-CM | POA: Diagnosis not present

## 2020-05-07 DIAGNOSIS — D2261 Melanocytic nevi of right upper limb, including shoulder: Secondary | ICD-10-CM | POA: Diagnosis not present

## 2020-05-07 DIAGNOSIS — L720 Epidermal cyst: Secondary | ICD-10-CM | POA: Diagnosis not present

## 2020-05-07 DIAGNOSIS — D2272 Melanocytic nevi of left lower limb, including hip: Secondary | ICD-10-CM | POA: Diagnosis not present

## 2020-05-07 DIAGNOSIS — D2271 Melanocytic nevi of right lower limb, including hip: Secondary | ICD-10-CM | POA: Diagnosis not present

## 2020-05-07 DIAGNOSIS — D225 Melanocytic nevi of trunk: Secondary | ICD-10-CM | POA: Diagnosis not present

## 2020-05-07 DIAGNOSIS — D1801 Hemangioma of skin and subcutaneous tissue: Secondary | ICD-10-CM | POA: Diagnosis not present

## 2020-05-07 DIAGNOSIS — Z85828 Personal history of other malignant neoplasm of skin: Secondary | ICD-10-CM | POA: Diagnosis not present

## 2020-05-07 DIAGNOSIS — D2262 Melanocytic nevi of left upper limb, including shoulder: Secondary | ICD-10-CM | POA: Diagnosis not present

## 2020-05-13 DIAGNOSIS — Z23 Encounter for immunization: Secondary | ICD-10-CM | POA: Diagnosis not present

## 2020-05-26 DIAGNOSIS — Z23 Encounter for immunization: Secondary | ICD-10-CM | POA: Diagnosis not present

## 2020-06-14 DIAGNOSIS — D320 Benign neoplasm of cerebral meninges: Secondary | ICD-10-CM | POA: Diagnosis not present

## 2020-06-14 DIAGNOSIS — D329 Benign neoplasm of meninges, unspecified: Secondary | ICD-10-CM | POA: Diagnosis not present

## 2020-06-14 DIAGNOSIS — Z510 Encounter for antineoplastic radiation therapy: Secondary | ICD-10-CM | POA: Diagnosis not present

## 2020-06-20 DIAGNOSIS — H04123 Dry eye syndrome of bilateral lacrimal glands: Secondary | ICD-10-CM | POA: Diagnosis not present

## 2020-06-20 DIAGNOSIS — H18523 Epithelial (juvenile) corneal dystrophy, bilateral: Secondary | ICD-10-CM | POA: Diagnosis not present

## 2020-06-20 DIAGNOSIS — H3530 Unspecified macular degeneration: Secondary | ICD-10-CM | POA: Diagnosis not present

## 2020-06-20 DIAGNOSIS — H35033 Hypertensive retinopathy, bilateral: Secondary | ICD-10-CM | POA: Diagnosis not present

## 2020-06-20 DIAGNOSIS — H2511 Age-related nuclear cataract, right eye: Secondary | ICD-10-CM | POA: Diagnosis not present

## 2020-06-20 DIAGNOSIS — Z961 Presence of intraocular lens: Secondary | ICD-10-CM | POA: Diagnosis not present

## 2020-07-30 DIAGNOSIS — H04123 Dry eye syndrome of bilateral lacrimal glands: Secondary | ICD-10-CM | POA: Diagnosis not present

## 2020-07-30 DIAGNOSIS — Z961 Presence of intraocular lens: Secondary | ICD-10-CM | POA: Diagnosis not present

## 2020-07-30 DIAGNOSIS — H18523 Epithelial (juvenile) corneal dystrophy, bilateral: Secondary | ICD-10-CM | POA: Diagnosis not present

## 2020-07-30 DIAGNOSIS — H2511 Age-related nuclear cataract, right eye: Secondary | ICD-10-CM | POA: Diagnosis not present

## 2020-07-30 DIAGNOSIS — Z7952 Long term (current) use of systemic steroids: Secondary | ICD-10-CM | POA: Diagnosis not present

## 2020-07-30 DIAGNOSIS — H353131 Nonexudative age-related macular degeneration, bilateral, early dry stage: Secondary | ICD-10-CM | POA: Diagnosis not present

## 2020-07-30 DIAGNOSIS — Z79899 Other long term (current) drug therapy: Secondary | ICD-10-CM | POA: Diagnosis not present

## 2020-08-22 DIAGNOSIS — D320 Benign neoplasm of cerebral meninges: Secondary | ICD-10-CM | POA: Diagnosis not present

## 2020-08-22 DIAGNOSIS — M1990 Unspecified osteoarthritis, unspecified site: Secondary | ICD-10-CM | POA: Diagnosis not present

## 2020-08-22 DIAGNOSIS — E663 Overweight: Secondary | ICD-10-CM | POA: Diagnosis not present

## 2020-08-22 DIAGNOSIS — H269 Unspecified cataract: Secondary | ICD-10-CM | POA: Diagnosis not present

## 2020-11-06 DIAGNOSIS — H18523 Epithelial (juvenile) corneal dystrophy, bilateral: Secondary | ICD-10-CM | POA: Diagnosis not present

## 2020-11-06 DIAGNOSIS — H2511 Age-related nuclear cataract, right eye: Secondary | ICD-10-CM | POA: Diagnosis not present

## 2020-11-22 DIAGNOSIS — H18523 Epithelial (juvenile) corneal dystrophy, bilateral: Secondary | ICD-10-CM | POA: Diagnosis not present

## 2020-11-22 DIAGNOSIS — H2511 Age-related nuclear cataract, right eye: Secondary | ICD-10-CM | POA: Diagnosis not present

## 2020-11-22 DIAGNOSIS — H25811 Combined forms of age-related cataract, right eye: Secondary | ICD-10-CM | POA: Diagnosis not present

## 2020-12-18 DIAGNOSIS — H18523 Epithelial (juvenile) corneal dystrophy, bilateral: Secondary | ICD-10-CM | POA: Diagnosis not present

## 2020-12-18 DIAGNOSIS — Z9841 Cataract extraction status, right eye: Secondary | ICD-10-CM | POA: Diagnosis not present

## 2020-12-18 DIAGNOSIS — H3581 Retinal edema: Secondary | ICD-10-CM | POA: Diagnosis not present

## 2020-12-18 DIAGNOSIS — H30033 Focal chorioretinal inflammation, peripheral, bilateral: Secondary | ICD-10-CM | POA: Diagnosis not present

## 2020-12-18 DIAGNOSIS — H04123 Dry eye syndrome of bilateral lacrimal glands: Secondary | ICD-10-CM | POA: Diagnosis not present

## 2020-12-18 DIAGNOSIS — Z961 Presence of intraocular lens: Secondary | ICD-10-CM | POA: Diagnosis not present

## 2020-12-19 DIAGNOSIS — H30033 Focal chorioretinal inflammation, peripheral, bilateral: Secondary | ICD-10-CM | POA: Diagnosis not present

## 2020-12-20 DIAGNOSIS — Z23 Encounter for immunization: Secondary | ICD-10-CM | POA: Diagnosis not present

## 2020-12-25 DIAGNOSIS — D421 Neoplasm of uncertain behavior of spinal meninges: Secondary | ICD-10-CM | POA: Diagnosis not present

## 2020-12-25 DIAGNOSIS — D329 Benign neoplasm of meninges, unspecified: Secondary | ICD-10-CM | POA: Diagnosis not present

## 2020-12-25 DIAGNOSIS — D420 Neoplasm of uncertain behavior of cerebral meninges: Secondary | ICD-10-CM | POA: Diagnosis not present

## 2020-12-26 DIAGNOSIS — H30033 Focal chorioretinal inflammation, peripheral, bilateral: Secondary | ICD-10-CM | POA: Diagnosis not present

## 2021-01-04 DIAGNOSIS — D1801 Hemangioma of skin and subcutaneous tissue: Secondary | ICD-10-CM | POA: Diagnosis not present

## 2021-01-04 DIAGNOSIS — Z85828 Personal history of other malignant neoplasm of skin: Secondary | ICD-10-CM | POA: Diagnosis not present

## 2021-01-08 DIAGNOSIS — H30033 Focal chorioretinal inflammation, peripheral, bilateral: Secondary | ICD-10-CM | POA: Diagnosis not present

## 2021-01-08 DIAGNOSIS — Z961 Presence of intraocular lens: Secondary | ICD-10-CM | POA: Diagnosis not present

## 2021-01-08 DIAGNOSIS — H04123 Dry eye syndrome of bilateral lacrimal glands: Secondary | ICD-10-CM | POA: Diagnosis not present

## 2021-01-08 DIAGNOSIS — H18523 Epithelial (juvenile) corneal dystrophy, bilateral: Secondary | ICD-10-CM | POA: Diagnosis not present

## 2021-01-08 DIAGNOSIS — H3581 Retinal edema: Secondary | ICD-10-CM | POA: Diagnosis not present

## 2021-01-21 DIAGNOSIS — N390 Urinary tract infection, site not specified: Secondary | ICD-10-CM | POA: Diagnosis not present

## 2021-02-19 DIAGNOSIS — Z961 Presence of intraocular lens: Secondary | ICD-10-CM | POA: Diagnosis not present

## 2021-02-19 DIAGNOSIS — H04123 Dry eye syndrome of bilateral lacrimal glands: Secondary | ICD-10-CM | POA: Diagnosis not present

## 2021-02-19 DIAGNOSIS — H18523 Epithelial (juvenile) corneal dystrophy, bilateral: Secondary | ICD-10-CM | POA: Diagnosis not present

## 2021-02-19 DIAGNOSIS — H30033 Focal chorioretinal inflammation, peripheral, bilateral: Secondary | ICD-10-CM | POA: Diagnosis not present

## 2021-02-19 DIAGNOSIS — H3581 Retinal edema: Secondary | ICD-10-CM | POA: Diagnosis not present

## 2021-03-04 DIAGNOSIS — M859 Disorder of bone density and structure, unspecified: Secondary | ICD-10-CM | POA: Diagnosis not present

## 2021-03-04 DIAGNOSIS — E785 Hyperlipidemia, unspecified: Secondary | ICD-10-CM | POA: Diagnosis not present

## 2021-03-18 DIAGNOSIS — M8580 Other specified disorders of bone density and structure, unspecified site: Secondary | ICD-10-CM | POA: Diagnosis not present

## 2021-03-18 DIAGNOSIS — E663 Overweight: Secondary | ICD-10-CM | POA: Diagnosis not present

## 2021-03-18 DIAGNOSIS — M1990 Unspecified osteoarthritis, unspecified site: Secondary | ICD-10-CM | POA: Diagnosis not present

## 2021-03-18 DIAGNOSIS — Z0000 Encounter for general adult medical examination without abnormal findings: Secondary | ICD-10-CM | POA: Diagnosis not present

## 2021-03-18 DIAGNOSIS — Z1212 Encounter for screening for malignant neoplasm of rectum: Secondary | ICD-10-CM | POA: Diagnosis not present

## 2021-03-18 DIAGNOSIS — N390 Urinary tract infection, site not specified: Secondary | ICD-10-CM | POA: Diagnosis not present

## 2021-03-18 DIAGNOSIS — E785 Hyperlipidemia, unspecified: Secondary | ICD-10-CM | POA: Diagnosis not present

## 2021-03-18 DIAGNOSIS — M170 Bilateral primary osteoarthritis of knee: Secondary | ICD-10-CM | POA: Diagnosis not present

## 2021-03-18 DIAGNOSIS — I447 Left bundle-branch block, unspecified: Secondary | ICD-10-CM | POA: Diagnosis not present

## 2021-04-17 DIAGNOSIS — H04123 Dry eye syndrome of bilateral lacrimal glands: Secondary | ICD-10-CM | POA: Diagnosis not present

## 2021-04-17 DIAGNOSIS — H18523 Epithelial (juvenile) corneal dystrophy, bilateral: Secondary | ICD-10-CM | POA: Diagnosis not present

## 2021-04-17 DIAGNOSIS — Z961 Presence of intraocular lens: Secondary | ICD-10-CM | POA: Diagnosis not present

## 2021-05-08 DIAGNOSIS — Z85828 Personal history of other malignant neoplasm of skin: Secondary | ICD-10-CM | POA: Diagnosis not present

## 2021-05-08 DIAGNOSIS — L720 Epidermal cyst: Secondary | ICD-10-CM | POA: Diagnosis not present

## 2021-05-08 DIAGNOSIS — D225 Melanocytic nevi of trunk: Secondary | ICD-10-CM | POA: Diagnosis not present

## 2021-05-08 DIAGNOSIS — C44519 Basal cell carcinoma of skin of other part of trunk: Secondary | ICD-10-CM | POA: Diagnosis not present

## 2021-05-08 DIAGNOSIS — D2272 Melanocytic nevi of left lower limb, including hip: Secondary | ICD-10-CM | POA: Diagnosis not present

## 2021-05-08 DIAGNOSIS — D485 Neoplasm of uncertain behavior of skin: Secondary | ICD-10-CM | POA: Diagnosis not present

## 2021-05-08 DIAGNOSIS — D2271 Melanocytic nevi of right lower limb, including hip: Secondary | ICD-10-CM | POA: Diagnosis not present

## 2021-05-08 DIAGNOSIS — L821 Other seborrheic keratosis: Secondary | ICD-10-CM | POA: Diagnosis not present

## 2021-05-14 DIAGNOSIS — H30033 Focal chorioretinal inflammation, peripheral, bilateral: Secondary | ICD-10-CM | POA: Diagnosis not present

## 2021-05-14 DIAGNOSIS — H3581 Retinal edema: Secondary | ICD-10-CM | POA: Diagnosis not present

## 2021-05-14 DIAGNOSIS — Z961 Presence of intraocular lens: Secondary | ICD-10-CM | POA: Diagnosis not present

## 2021-05-14 DIAGNOSIS — H04123 Dry eye syndrome of bilateral lacrimal glands: Secondary | ICD-10-CM | POA: Diagnosis not present

## 2021-05-14 DIAGNOSIS — H18523 Epithelial (juvenile) corneal dystrophy, bilateral: Secondary | ICD-10-CM | POA: Diagnosis not present

## 2021-05-25 DIAGNOSIS — Z23 Encounter for immunization: Secondary | ICD-10-CM | POA: Diagnosis not present

## 2021-06-03 DIAGNOSIS — Z23 Encounter for immunization: Secondary | ICD-10-CM | POA: Diagnosis not present

## 2021-06-17 DIAGNOSIS — H18522 Epithelial (juvenile) corneal dystrophy, left eye: Secondary | ICD-10-CM | POA: Diagnosis not present

## 2021-06-17 DIAGNOSIS — H04123 Dry eye syndrome of bilateral lacrimal glands: Secondary | ICD-10-CM | POA: Diagnosis not present

## 2021-08-06 DIAGNOSIS — Z961 Presence of intraocular lens: Secondary | ICD-10-CM | POA: Diagnosis not present

## 2021-08-06 DIAGNOSIS — H30033 Focal chorioretinal inflammation, peripheral, bilateral: Secondary | ICD-10-CM | POA: Diagnosis not present

## 2021-08-06 DIAGNOSIS — H04123 Dry eye syndrome of bilateral lacrimal glands: Secondary | ICD-10-CM | POA: Diagnosis not present

## 2021-08-06 DIAGNOSIS — H18523 Epithelial (juvenile) corneal dystrophy, bilateral: Secondary | ICD-10-CM | POA: Diagnosis not present

## 2021-08-06 DIAGNOSIS — H3581 Retinal edema: Secondary | ICD-10-CM | POA: Diagnosis not present

## 2021-09-17 DIAGNOSIS — D485 Neoplasm of uncertain behavior of skin: Secondary | ICD-10-CM | POA: Diagnosis not present

## 2021-09-17 DIAGNOSIS — Z85828 Personal history of other malignant neoplasm of skin: Secondary | ICD-10-CM | POA: Diagnosis not present

## 2021-09-17 DIAGNOSIS — D489 Neoplasm of uncertain behavior, unspecified: Secondary | ICD-10-CM | POA: Diagnosis not present

## 2021-09-23 DIAGNOSIS — I447 Left bundle-branch block, unspecified: Secondary | ICD-10-CM | POA: Diagnosis not present

## 2021-09-23 DIAGNOSIS — M8580 Other specified disorders of bone density and structure, unspecified site: Secondary | ICD-10-CM | POA: Diagnosis not present

## 2021-09-23 DIAGNOSIS — E785 Hyperlipidemia, unspecified: Secondary | ICD-10-CM | POA: Diagnosis not present

## 2021-09-23 DIAGNOSIS — E663 Overweight: Secondary | ICD-10-CM | POA: Diagnosis not present

## 2021-09-23 DIAGNOSIS — G4700 Insomnia, unspecified: Secondary | ICD-10-CM | POA: Diagnosis not present

## 2021-12-31 DIAGNOSIS — H18523 Epithelial (juvenile) corneal dystrophy, bilateral: Secondary | ICD-10-CM | POA: Diagnosis not present

## 2021-12-31 DIAGNOSIS — H3581 Retinal edema: Secondary | ICD-10-CM | POA: Diagnosis not present

## 2021-12-31 DIAGNOSIS — H04123 Dry eye syndrome of bilateral lacrimal glands: Secondary | ICD-10-CM | POA: Diagnosis not present

## 2021-12-31 DIAGNOSIS — Z961 Presence of intraocular lens: Secondary | ICD-10-CM | POA: Diagnosis not present

## 2021-12-31 DIAGNOSIS — H30033 Focal chorioretinal inflammation, peripheral, bilateral: Secondary | ICD-10-CM | POA: Diagnosis not present

## 2022-02-13 DIAGNOSIS — R42 Dizziness and giddiness: Secondary | ICD-10-CM | POA: Diagnosis not present

## 2022-02-13 DIAGNOSIS — M25532 Pain in left wrist: Secondary | ICD-10-CM | POA: Diagnosis not present

## 2022-02-13 DIAGNOSIS — M170 Bilateral primary osteoarthritis of knee: Secondary | ICD-10-CM | POA: Diagnosis not present

## 2022-02-17 DIAGNOSIS — R42 Dizziness and giddiness: Secondary | ICD-10-CM | POA: Diagnosis not present

## 2022-02-17 DIAGNOSIS — M25532 Pain in left wrist: Secondary | ICD-10-CM | POA: Diagnosis not present

## 2022-02-17 DIAGNOSIS — M170 Bilateral primary osteoarthritis of knee: Secondary | ICD-10-CM | POA: Diagnosis not present

## 2022-02-20 DIAGNOSIS — M170 Bilateral primary osteoarthritis of knee: Secondary | ICD-10-CM | POA: Diagnosis not present

## 2022-02-20 DIAGNOSIS — R42 Dizziness and giddiness: Secondary | ICD-10-CM | POA: Diagnosis not present

## 2022-02-20 DIAGNOSIS — M25532 Pain in left wrist: Secondary | ICD-10-CM | POA: Diagnosis not present

## 2022-02-25 DIAGNOSIS — M170 Bilateral primary osteoarthritis of knee: Secondary | ICD-10-CM | POA: Diagnosis not present

## 2022-02-25 DIAGNOSIS — R42 Dizziness and giddiness: Secondary | ICD-10-CM | POA: Diagnosis not present

## 2022-02-25 DIAGNOSIS — M25532 Pain in left wrist: Secondary | ICD-10-CM | POA: Diagnosis not present

## 2022-02-27 DIAGNOSIS — M170 Bilateral primary osteoarthritis of knee: Secondary | ICD-10-CM | POA: Diagnosis not present

## 2022-02-27 DIAGNOSIS — M25532 Pain in left wrist: Secondary | ICD-10-CM | POA: Diagnosis not present

## 2022-02-27 DIAGNOSIS — R42 Dizziness and giddiness: Secondary | ICD-10-CM | POA: Diagnosis not present

## 2022-03-05 DIAGNOSIS — M170 Bilateral primary osteoarthritis of knee: Secondary | ICD-10-CM | POA: Diagnosis not present

## 2022-03-05 DIAGNOSIS — M25532 Pain in left wrist: Secondary | ICD-10-CM | POA: Diagnosis not present

## 2022-03-05 DIAGNOSIS — R42 Dizziness and giddiness: Secondary | ICD-10-CM | POA: Diagnosis not present

## 2022-03-07 DIAGNOSIS — R42 Dizziness and giddiness: Secondary | ICD-10-CM | POA: Diagnosis not present

## 2022-03-07 DIAGNOSIS — M25532 Pain in left wrist: Secondary | ICD-10-CM | POA: Diagnosis not present

## 2022-03-07 DIAGNOSIS — M170 Bilateral primary osteoarthritis of knee: Secondary | ICD-10-CM | POA: Diagnosis not present

## 2022-03-10 DIAGNOSIS — R42 Dizziness and giddiness: Secondary | ICD-10-CM | POA: Diagnosis not present

## 2022-03-10 DIAGNOSIS — M25532 Pain in left wrist: Secondary | ICD-10-CM | POA: Diagnosis not present

## 2022-03-10 DIAGNOSIS — M170 Bilateral primary osteoarthritis of knee: Secondary | ICD-10-CM | POA: Diagnosis not present

## 2022-03-13 DIAGNOSIS — R42 Dizziness and giddiness: Secondary | ICD-10-CM | POA: Diagnosis not present

## 2022-03-13 DIAGNOSIS — M170 Bilateral primary osteoarthritis of knee: Secondary | ICD-10-CM | POA: Diagnosis not present

## 2022-03-13 DIAGNOSIS — M25532 Pain in left wrist: Secondary | ICD-10-CM | POA: Diagnosis not present

## 2022-03-17 DIAGNOSIS — E785 Hyperlipidemia, unspecified: Secondary | ICD-10-CM | POA: Diagnosis not present

## 2022-03-17 DIAGNOSIS — R7989 Other specified abnormal findings of blood chemistry: Secondary | ICD-10-CM | POA: Diagnosis not present

## 2022-03-17 DIAGNOSIS — Z79899 Other long term (current) drug therapy: Secondary | ICD-10-CM | POA: Diagnosis not present

## 2022-03-18 DIAGNOSIS — R42 Dizziness and giddiness: Secondary | ICD-10-CM | POA: Diagnosis not present

## 2022-03-18 DIAGNOSIS — M25532 Pain in left wrist: Secondary | ICD-10-CM | POA: Diagnosis not present

## 2022-03-18 DIAGNOSIS — M170 Bilateral primary osteoarthritis of knee: Secondary | ICD-10-CM | POA: Diagnosis not present

## 2022-03-20 DIAGNOSIS — R42 Dizziness and giddiness: Secondary | ICD-10-CM | POA: Diagnosis not present

## 2022-03-20 DIAGNOSIS — M25532 Pain in left wrist: Secondary | ICD-10-CM | POA: Diagnosis not present

## 2022-03-20 DIAGNOSIS — M170 Bilateral primary osteoarthritis of knee: Secondary | ICD-10-CM | POA: Diagnosis not present

## 2022-03-21 DIAGNOSIS — Z0000 Encounter for general adult medical examination without abnormal findings: Secondary | ICD-10-CM | POA: Diagnosis not present

## 2022-03-21 DIAGNOSIS — Z1212 Encounter for screening for malignant neoplasm of rectum: Secondary | ICD-10-CM | POA: Diagnosis not present

## 2022-03-24 DIAGNOSIS — M2550 Pain in unspecified joint: Secondary | ICD-10-CM | POA: Diagnosis not present

## 2022-03-24 DIAGNOSIS — Z1331 Encounter for screening for depression: Secondary | ICD-10-CM | POA: Diagnosis not present

## 2022-03-24 DIAGNOSIS — M2510 Fistula, unspecified joint: Secondary | ICD-10-CM | POA: Diagnosis not present

## 2022-03-24 DIAGNOSIS — Z0000 Encounter for general adult medical examination without abnormal findings: Secondary | ICD-10-CM | POA: Diagnosis not present

## 2022-03-24 DIAGNOSIS — I447 Left bundle-branch block, unspecified: Secondary | ICD-10-CM | POA: Diagnosis not present

## 2022-03-24 DIAGNOSIS — Z1339 Encounter for screening examination for other mental health and behavioral disorders: Secondary | ICD-10-CM | POA: Diagnosis not present

## 2022-03-24 DIAGNOSIS — R82998 Other abnormal findings in urine: Secondary | ICD-10-CM | POA: Diagnosis not present

## 2022-03-24 DIAGNOSIS — E663 Overweight: Secondary | ICD-10-CM | POA: Diagnosis not present

## 2022-03-24 DIAGNOSIS — E785 Hyperlipidemia, unspecified: Secondary | ICD-10-CM | POA: Diagnosis not present

## 2022-03-25 ENCOUNTER — Other Ambulatory Visit: Payer: Self-pay | Admitting: Internal Medicine

## 2022-03-25 DIAGNOSIS — J019 Acute sinusitis, unspecified: Secondary | ICD-10-CM

## 2022-03-26 ENCOUNTER — Ambulatory Visit
Admission: RE | Admit: 2022-03-26 | Discharge: 2022-03-26 | Disposition: A | Discharge status: Home / Self Care | Payer: Medicare Other | Source: Ambulatory Visit | Attending: Internal Medicine | Admitting: Internal Medicine

## 2022-03-26 DIAGNOSIS — J019 Acute sinusitis, unspecified: Secondary | ICD-10-CM | POA: Diagnosis not present

## 2022-03-26 DIAGNOSIS — R220 Localized swelling, mass and lump, head: Secondary | ICD-10-CM | POA: Diagnosis not present

## 2022-03-27 DIAGNOSIS — M170 Bilateral primary osteoarthritis of knee: Secondary | ICD-10-CM | POA: Diagnosis not present

## 2022-03-27 DIAGNOSIS — R42 Dizziness and giddiness: Secondary | ICD-10-CM | POA: Diagnosis not present

## 2022-03-27 DIAGNOSIS — M25532 Pain in left wrist: Secondary | ICD-10-CM | POA: Diagnosis not present

## 2022-03-31 DIAGNOSIS — M25532 Pain in left wrist: Secondary | ICD-10-CM | POA: Diagnosis not present

## 2022-03-31 DIAGNOSIS — M170 Bilateral primary osteoarthritis of knee: Secondary | ICD-10-CM | POA: Diagnosis not present

## 2022-03-31 DIAGNOSIS — R42 Dizziness and giddiness: Secondary | ICD-10-CM | POA: Diagnosis not present

## 2022-04-02 DIAGNOSIS — R42 Dizziness and giddiness: Secondary | ICD-10-CM | POA: Diagnosis not present

## 2022-04-02 DIAGNOSIS — M170 Bilateral primary osteoarthritis of knee: Secondary | ICD-10-CM | POA: Diagnosis not present

## 2022-04-02 DIAGNOSIS — M25532 Pain in left wrist: Secondary | ICD-10-CM | POA: Diagnosis not present

## 2022-04-07 DIAGNOSIS — M25532 Pain in left wrist: Secondary | ICD-10-CM | POA: Diagnosis not present

## 2022-04-07 DIAGNOSIS — M170 Bilateral primary osteoarthritis of knee: Secondary | ICD-10-CM | POA: Diagnosis not present

## 2022-04-07 DIAGNOSIS — R42 Dizziness and giddiness: Secondary | ICD-10-CM | POA: Diagnosis not present

## 2022-04-10 DIAGNOSIS — M170 Bilateral primary osteoarthritis of knee: Secondary | ICD-10-CM | POA: Diagnosis not present

## 2022-04-10 DIAGNOSIS — R42 Dizziness and giddiness: Secondary | ICD-10-CM | POA: Diagnosis not present

## 2022-04-10 DIAGNOSIS — M25532 Pain in left wrist: Secondary | ICD-10-CM | POA: Diagnosis not present

## 2022-04-14 DIAGNOSIS — M25532 Pain in left wrist: Secondary | ICD-10-CM | POA: Diagnosis not present

## 2022-04-14 DIAGNOSIS — M170 Bilateral primary osteoarthritis of knee: Secondary | ICD-10-CM | POA: Diagnosis not present

## 2022-04-14 DIAGNOSIS — R42 Dizziness and giddiness: Secondary | ICD-10-CM | POA: Diagnosis not present

## 2022-04-17 DIAGNOSIS — M25532 Pain in left wrist: Secondary | ICD-10-CM | POA: Diagnosis not present

## 2022-04-17 DIAGNOSIS — M170 Bilateral primary osteoarthritis of knee: Secondary | ICD-10-CM | POA: Diagnosis not present

## 2022-04-17 DIAGNOSIS — R42 Dizziness and giddiness: Secondary | ICD-10-CM | POA: Diagnosis not present

## 2022-04-22 DIAGNOSIS — M170 Bilateral primary osteoarthritis of knee: Secondary | ICD-10-CM | POA: Diagnosis not present

## 2022-04-22 DIAGNOSIS — M25532 Pain in left wrist: Secondary | ICD-10-CM | POA: Diagnosis not present

## 2022-04-22 DIAGNOSIS — R42 Dizziness and giddiness: Secondary | ICD-10-CM | POA: Diagnosis not present

## 2022-04-24 DIAGNOSIS — M170 Bilateral primary osteoarthritis of knee: Secondary | ICD-10-CM | POA: Diagnosis not present

## 2022-04-24 DIAGNOSIS — M25532 Pain in left wrist: Secondary | ICD-10-CM | POA: Diagnosis not present

## 2022-04-24 DIAGNOSIS — R42 Dizziness and giddiness: Secondary | ICD-10-CM | POA: Diagnosis not present

## 2022-04-29 DIAGNOSIS — R42 Dizziness and giddiness: Secondary | ICD-10-CM | POA: Diagnosis not present

## 2022-04-29 DIAGNOSIS — M25532 Pain in left wrist: Secondary | ICD-10-CM | POA: Diagnosis not present

## 2022-04-29 DIAGNOSIS — M170 Bilateral primary osteoarthritis of knee: Secondary | ICD-10-CM | POA: Diagnosis not present

## 2022-05-01 DIAGNOSIS — R42 Dizziness and giddiness: Secondary | ICD-10-CM | POA: Diagnosis not present

## 2022-05-01 DIAGNOSIS — M170 Bilateral primary osteoarthritis of knee: Secondary | ICD-10-CM | POA: Diagnosis not present

## 2022-05-01 DIAGNOSIS — M25532 Pain in left wrist: Secondary | ICD-10-CM | POA: Diagnosis not present

## 2022-05-05 DIAGNOSIS — R42 Dizziness and giddiness: Secondary | ICD-10-CM | POA: Diagnosis not present

## 2022-05-05 DIAGNOSIS — M25532 Pain in left wrist: Secondary | ICD-10-CM | POA: Diagnosis not present

## 2022-05-05 DIAGNOSIS — M170 Bilateral primary osteoarthritis of knee: Secondary | ICD-10-CM | POA: Diagnosis not present

## 2022-05-07 DIAGNOSIS — M25532 Pain in left wrist: Secondary | ICD-10-CM | POA: Diagnosis not present

## 2022-05-07 DIAGNOSIS — R42 Dizziness and giddiness: Secondary | ICD-10-CM | POA: Diagnosis not present

## 2022-05-07 DIAGNOSIS — M170 Bilateral primary osteoarthritis of knee: Secondary | ICD-10-CM | POA: Diagnosis not present

## 2022-05-08 DIAGNOSIS — D2271 Melanocytic nevi of right lower limb, including hip: Secondary | ICD-10-CM | POA: Diagnosis not present

## 2022-05-08 DIAGNOSIS — D1801 Hemangioma of skin and subcutaneous tissue: Secondary | ICD-10-CM | POA: Diagnosis not present

## 2022-05-08 DIAGNOSIS — Z85828 Personal history of other malignant neoplasm of skin: Secondary | ICD-10-CM | POA: Diagnosis not present

## 2022-05-08 DIAGNOSIS — D485 Neoplasm of uncertain behavior of skin: Secondary | ICD-10-CM | POA: Diagnosis not present

## 2022-05-08 DIAGNOSIS — D2272 Melanocytic nevi of left lower limb, including hip: Secondary | ICD-10-CM | POA: Diagnosis not present

## 2022-05-08 DIAGNOSIS — D2261 Melanocytic nevi of right upper limb, including shoulder: Secondary | ICD-10-CM | POA: Diagnosis not present

## 2022-05-08 DIAGNOSIS — D2262 Melanocytic nevi of left upper limb, including shoulder: Secondary | ICD-10-CM | POA: Diagnosis not present

## 2022-05-08 DIAGNOSIS — D225 Melanocytic nevi of trunk: Secondary | ICD-10-CM | POA: Diagnosis not present

## 2022-05-08 DIAGNOSIS — L821 Other seborrheic keratosis: Secondary | ICD-10-CM | POA: Diagnosis not present

## 2022-05-12 DIAGNOSIS — M25532 Pain in left wrist: Secondary | ICD-10-CM | POA: Diagnosis not present

## 2022-05-12 DIAGNOSIS — R42 Dizziness and giddiness: Secondary | ICD-10-CM | POA: Diagnosis not present

## 2022-05-12 DIAGNOSIS — M170 Bilateral primary osteoarthritis of knee: Secondary | ICD-10-CM | POA: Diagnosis not present

## 2022-05-13 DIAGNOSIS — Z23 Encounter for immunization: Secondary | ICD-10-CM | POA: Diagnosis not present

## 2022-05-15 DIAGNOSIS — M25532 Pain in left wrist: Secondary | ICD-10-CM | POA: Diagnosis not present

## 2022-05-15 DIAGNOSIS — M170 Bilateral primary osteoarthritis of knee: Secondary | ICD-10-CM | POA: Diagnosis not present

## 2022-05-15 DIAGNOSIS — R42 Dizziness and giddiness: Secondary | ICD-10-CM | POA: Diagnosis not present

## 2022-05-19 DIAGNOSIS — M25532 Pain in left wrist: Secondary | ICD-10-CM | POA: Diagnosis not present

## 2022-05-19 DIAGNOSIS — M170 Bilateral primary osteoarthritis of knee: Secondary | ICD-10-CM | POA: Diagnosis not present

## 2022-05-19 DIAGNOSIS — R42 Dizziness and giddiness: Secondary | ICD-10-CM | POA: Diagnosis not present

## 2022-05-26 DIAGNOSIS — M170 Bilateral primary osteoarthritis of knee: Secondary | ICD-10-CM | POA: Diagnosis not present

## 2022-05-26 DIAGNOSIS — R42 Dizziness and giddiness: Secondary | ICD-10-CM | POA: Diagnosis not present

## 2022-05-26 DIAGNOSIS — M25532 Pain in left wrist: Secondary | ICD-10-CM | POA: Diagnosis not present

## 2022-05-27 DIAGNOSIS — L988 Other specified disorders of the skin and subcutaneous tissue: Secondary | ICD-10-CM | POA: Diagnosis not present

## 2022-05-27 DIAGNOSIS — D485 Neoplasm of uncertain behavior of skin: Secondary | ICD-10-CM | POA: Diagnosis not present

## 2022-05-27 DIAGNOSIS — Z85828 Personal history of other malignant neoplasm of skin: Secondary | ICD-10-CM | POA: Diagnosis not present

## 2022-05-28 DIAGNOSIS — M25532 Pain in left wrist: Secondary | ICD-10-CM | POA: Diagnosis not present

## 2022-05-28 DIAGNOSIS — R42 Dizziness and giddiness: Secondary | ICD-10-CM | POA: Diagnosis not present

## 2022-05-28 DIAGNOSIS — M170 Bilateral primary osteoarthritis of knee: Secondary | ICD-10-CM | POA: Diagnosis not present

## 2022-05-31 DIAGNOSIS — Z23 Encounter for immunization: Secondary | ICD-10-CM | POA: Diagnosis not present

## 2022-06-02 DIAGNOSIS — M25532 Pain in left wrist: Secondary | ICD-10-CM | POA: Diagnosis not present

## 2022-06-02 DIAGNOSIS — M170 Bilateral primary osteoarthritis of knee: Secondary | ICD-10-CM | POA: Diagnosis not present

## 2022-06-02 DIAGNOSIS — R42 Dizziness and giddiness: Secondary | ICD-10-CM | POA: Diagnosis not present

## 2022-06-05 DIAGNOSIS — M170 Bilateral primary osteoarthritis of knee: Secondary | ICD-10-CM | POA: Diagnosis not present

## 2022-06-05 DIAGNOSIS — R42 Dizziness and giddiness: Secondary | ICD-10-CM | POA: Diagnosis not present

## 2022-06-05 DIAGNOSIS — M25532 Pain in left wrist: Secondary | ICD-10-CM | POA: Diagnosis not present

## 2022-06-13 DIAGNOSIS — Z4802 Encounter for removal of sutures: Secondary | ICD-10-CM | POA: Diagnosis not present

## 2022-06-17 DIAGNOSIS — M25532 Pain in left wrist: Secondary | ICD-10-CM | POA: Diagnosis not present

## 2022-06-17 DIAGNOSIS — M170 Bilateral primary osteoarthritis of knee: Secondary | ICD-10-CM | POA: Diagnosis not present

## 2022-06-17 DIAGNOSIS — R42 Dizziness and giddiness: Secondary | ICD-10-CM | POA: Diagnosis not present

## 2022-06-20 DIAGNOSIS — M170 Bilateral primary osteoarthritis of knee: Secondary | ICD-10-CM | POA: Diagnosis not present

## 2022-06-20 DIAGNOSIS — R42 Dizziness and giddiness: Secondary | ICD-10-CM | POA: Diagnosis not present

## 2022-06-20 DIAGNOSIS — M25532 Pain in left wrist: Secondary | ICD-10-CM | POA: Diagnosis not present

## 2022-06-24 DIAGNOSIS — M25532 Pain in left wrist: Secondary | ICD-10-CM | POA: Diagnosis not present

## 2022-06-24 DIAGNOSIS — R42 Dizziness and giddiness: Secondary | ICD-10-CM | POA: Diagnosis not present

## 2022-06-24 DIAGNOSIS — M170 Bilateral primary osteoarthritis of knee: Secondary | ICD-10-CM | POA: Diagnosis not present

## 2022-06-26 DIAGNOSIS — D320 Benign neoplasm of cerebral meninges: Secondary | ICD-10-CM | POA: Diagnosis not present

## 2022-06-26 DIAGNOSIS — D329 Benign neoplasm of meninges, unspecified: Secondary | ICD-10-CM | POA: Diagnosis not present

## 2022-06-26 DIAGNOSIS — Z9889 Other specified postprocedural states: Secondary | ICD-10-CM | POA: Diagnosis not present

## 2022-06-27 DIAGNOSIS — M170 Bilateral primary osteoarthritis of knee: Secondary | ICD-10-CM | POA: Diagnosis not present

## 2022-06-27 DIAGNOSIS — M25532 Pain in left wrist: Secondary | ICD-10-CM | POA: Diagnosis not present

## 2022-06-27 DIAGNOSIS — R42 Dizziness and giddiness: Secondary | ICD-10-CM | POA: Diagnosis not present

## 2022-07-04 DIAGNOSIS — R42 Dizziness and giddiness: Secondary | ICD-10-CM | POA: Diagnosis not present

## 2022-07-04 DIAGNOSIS — M25532 Pain in left wrist: Secondary | ICD-10-CM | POA: Diagnosis not present

## 2022-07-04 DIAGNOSIS — M170 Bilateral primary osteoarthritis of knee: Secondary | ICD-10-CM | POA: Diagnosis not present

## 2022-07-08 DIAGNOSIS — R42 Dizziness and giddiness: Secondary | ICD-10-CM | POA: Diagnosis not present

## 2022-07-08 DIAGNOSIS — M25532 Pain in left wrist: Secondary | ICD-10-CM | POA: Diagnosis not present

## 2022-07-08 DIAGNOSIS — M170 Bilateral primary osteoarthritis of knee: Secondary | ICD-10-CM | POA: Diagnosis not present

## 2022-07-15 DIAGNOSIS — R42 Dizziness and giddiness: Secondary | ICD-10-CM | POA: Diagnosis not present

## 2022-07-15 DIAGNOSIS — M170 Bilateral primary osteoarthritis of knee: Secondary | ICD-10-CM | POA: Diagnosis not present

## 2022-07-15 DIAGNOSIS — M25532 Pain in left wrist: Secondary | ICD-10-CM | POA: Diagnosis not present

## 2022-07-18 DIAGNOSIS — M170 Bilateral primary osteoarthritis of knee: Secondary | ICD-10-CM | POA: Diagnosis not present

## 2022-07-18 DIAGNOSIS — M25532 Pain in left wrist: Secondary | ICD-10-CM | POA: Diagnosis not present

## 2022-07-18 DIAGNOSIS — R42 Dizziness and giddiness: Secondary | ICD-10-CM | POA: Diagnosis not present

## 2022-07-22 DIAGNOSIS — R42 Dizziness and giddiness: Secondary | ICD-10-CM | POA: Diagnosis not present

## 2022-07-22 DIAGNOSIS — M170 Bilateral primary osteoarthritis of knee: Secondary | ICD-10-CM | POA: Diagnosis not present

## 2022-07-22 DIAGNOSIS — M25532 Pain in left wrist: Secondary | ICD-10-CM | POA: Diagnosis not present

## 2022-07-25 DIAGNOSIS — M25532 Pain in left wrist: Secondary | ICD-10-CM | POA: Diagnosis not present

## 2022-07-25 DIAGNOSIS — M170 Bilateral primary osteoarthritis of knee: Secondary | ICD-10-CM | POA: Diagnosis not present

## 2022-07-25 DIAGNOSIS — R42 Dizziness and giddiness: Secondary | ICD-10-CM | POA: Diagnosis not present

## 2022-08-01 DIAGNOSIS — R42 Dizziness and giddiness: Secondary | ICD-10-CM | POA: Diagnosis not present

## 2022-08-01 DIAGNOSIS — M25532 Pain in left wrist: Secondary | ICD-10-CM | POA: Diagnosis not present

## 2022-08-01 DIAGNOSIS — M170 Bilateral primary osteoarthritis of knee: Secondary | ICD-10-CM | POA: Diagnosis not present

## 2022-08-05 DIAGNOSIS — R42 Dizziness and giddiness: Secondary | ICD-10-CM | POA: Diagnosis not present

## 2022-08-05 DIAGNOSIS — M25532 Pain in left wrist: Secondary | ICD-10-CM | POA: Diagnosis not present

## 2022-08-05 DIAGNOSIS — M170 Bilateral primary osteoarthritis of knee: Secondary | ICD-10-CM | POA: Diagnosis not present

## 2022-08-07 DIAGNOSIS — M25532 Pain in left wrist: Secondary | ICD-10-CM | POA: Diagnosis not present

## 2022-08-07 DIAGNOSIS — R42 Dizziness and giddiness: Secondary | ICD-10-CM | POA: Diagnosis not present

## 2022-08-07 DIAGNOSIS — M170 Bilateral primary osteoarthritis of knee: Secondary | ICD-10-CM | POA: Diagnosis not present

## 2022-08-14 DIAGNOSIS — M25532 Pain in left wrist: Secondary | ICD-10-CM | POA: Diagnosis not present

## 2022-08-14 DIAGNOSIS — M170 Bilateral primary osteoarthritis of knee: Secondary | ICD-10-CM | POA: Diagnosis not present

## 2022-08-14 DIAGNOSIS — R42 Dizziness and giddiness: Secondary | ICD-10-CM | POA: Diagnosis not present

## 2022-08-19 DIAGNOSIS — M25532 Pain in left wrist: Secondary | ICD-10-CM | POA: Diagnosis not present

## 2022-08-19 DIAGNOSIS — R42 Dizziness and giddiness: Secondary | ICD-10-CM | POA: Diagnosis not present

## 2022-08-19 DIAGNOSIS — M170 Bilateral primary osteoarthritis of knee: Secondary | ICD-10-CM | POA: Diagnosis not present

## 2022-08-21 DIAGNOSIS — M170 Bilateral primary osteoarthritis of knee: Secondary | ICD-10-CM | POA: Diagnosis not present

## 2022-08-21 DIAGNOSIS — R42 Dizziness and giddiness: Secondary | ICD-10-CM | POA: Diagnosis not present

## 2022-08-21 DIAGNOSIS — M25532 Pain in left wrist: Secondary | ICD-10-CM | POA: Diagnosis not present

## 2022-08-28 DIAGNOSIS — R42 Dizziness and giddiness: Secondary | ICD-10-CM | POA: Diagnosis not present

## 2022-08-28 DIAGNOSIS — M170 Bilateral primary osteoarthritis of knee: Secondary | ICD-10-CM | POA: Diagnosis not present

## 2022-08-28 DIAGNOSIS — M25532 Pain in left wrist: Secondary | ICD-10-CM | POA: Diagnosis not present

## 2022-09-02 DIAGNOSIS — M25532 Pain in left wrist: Secondary | ICD-10-CM | POA: Diagnosis not present

## 2022-09-02 DIAGNOSIS — M170 Bilateral primary osteoarthritis of knee: Secondary | ICD-10-CM | POA: Diagnosis not present

## 2022-09-02 DIAGNOSIS — R42 Dizziness and giddiness: Secondary | ICD-10-CM | POA: Diagnosis not present

## 2022-09-04 DIAGNOSIS — M25532 Pain in left wrist: Secondary | ICD-10-CM | POA: Diagnosis not present

## 2022-09-04 DIAGNOSIS — M170 Bilateral primary osteoarthritis of knee: Secondary | ICD-10-CM | POA: Diagnosis not present

## 2022-09-04 DIAGNOSIS — R42 Dizziness and giddiness: Secondary | ICD-10-CM | POA: Diagnosis not present

## 2022-09-08 DIAGNOSIS — M25532 Pain in left wrist: Secondary | ICD-10-CM | POA: Diagnosis not present

## 2022-09-08 DIAGNOSIS — M170 Bilateral primary osteoarthritis of knee: Secondary | ICD-10-CM | POA: Diagnosis not present

## 2022-09-08 DIAGNOSIS — R42 Dizziness and giddiness: Secondary | ICD-10-CM | POA: Diagnosis not present

## 2022-09-11 DIAGNOSIS — M170 Bilateral primary osteoarthritis of knee: Secondary | ICD-10-CM | POA: Diagnosis not present

## 2022-09-11 DIAGNOSIS — M25532 Pain in left wrist: Secondary | ICD-10-CM | POA: Diagnosis not present

## 2022-09-11 DIAGNOSIS — R42 Dizziness and giddiness: Secondary | ICD-10-CM | POA: Diagnosis not present

## 2022-09-15 DIAGNOSIS — M25532 Pain in left wrist: Secondary | ICD-10-CM | POA: Diagnosis not present

## 2022-09-15 DIAGNOSIS — M170 Bilateral primary osteoarthritis of knee: Secondary | ICD-10-CM | POA: Diagnosis not present

## 2022-09-15 DIAGNOSIS — R42 Dizziness and giddiness: Secondary | ICD-10-CM | POA: Diagnosis not present

## 2022-09-18 DIAGNOSIS — M170 Bilateral primary osteoarthritis of knee: Secondary | ICD-10-CM | POA: Diagnosis not present

## 2022-09-18 DIAGNOSIS — R42 Dizziness and giddiness: Secondary | ICD-10-CM | POA: Diagnosis not present

## 2022-09-18 DIAGNOSIS — M25532 Pain in left wrist: Secondary | ICD-10-CM | POA: Diagnosis not present

## 2022-09-22 DIAGNOSIS — M170 Bilateral primary osteoarthritis of knee: Secondary | ICD-10-CM | POA: Diagnosis not present

## 2022-09-22 DIAGNOSIS — M25532 Pain in left wrist: Secondary | ICD-10-CM | POA: Diagnosis not present

## 2022-09-22 DIAGNOSIS — R42 Dizziness and giddiness: Secondary | ICD-10-CM | POA: Diagnosis not present

## 2022-09-25 DIAGNOSIS — M25532 Pain in left wrist: Secondary | ICD-10-CM | POA: Diagnosis not present

## 2022-09-25 DIAGNOSIS — M170 Bilateral primary osteoarthritis of knee: Secondary | ICD-10-CM | POA: Diagnosis not present

## 2022-09-25 DIAGNOSIS — R42 Dizziness and giddiness: Secondary | ICD-10-CM | POA: Diagnosis not present

## 2022-09-29 DIAGNOSIS — R42 Dizziness and giddiness: Secondary | ICD-10-CM | POA: Diagnosis not present

## 2022-09-29 DIAGNOSIS — M25532 Pain in left wrist: Secondary | ICD-10-CM | POA: Diagnosis not present

## 2022-09-29 DIAGNOSIS — M170 Bilateral primary osteoarthritis of knee: Secondary | ICD-10-CM | POA: Diagnosis not present

## 2022-10-02 DIAGNOSIS — M25532 Pain in left wrist: Secondary | ICD-10-CM | POA: Diagnosis not present

## 2022-10-02 DIAGNOSIS — R42 Dizziness and giddiness: Secondary | ICD-10-CM | POA: Diagnosis not present

## 2022-10-02 DIAGNOSIS — M170 Bilateral primary osteoarthritis of knee: Secondary | ICD-10-CM | POA: Diagnosis not present

## 2022-10-06 DIAGNOSIS — M25532 Pain in left wrist: Secondary | ICD-10-CM | POA: Diagnosis not present

## 2022-10-06 DIAGNOSIS — M170 Bilateral primary osteoarthritis of knee: Secondary | ICD-10-CM | POA: Diagnosis not present

## 2022-10-06 DIAGNOSIS — R42 Dizziness and giddiness: Secondary | ICD-10-CM | POA: Diagnosis not present

## 2022-10-08 DIAGNOSIS — Z9889 Other specified postprocedural states: Secondary | ICD-10-CM | POA: Diagnosis not present

## 2022-10-08 DIAGNOSIS — H04123 Dry eye syndrome of bilateral lacrimal glands: Secondary | ICD-10-CM | POA: Diagnosis not present

## 2022-10-08 DIAGNOSIS — Z961 Presence of intraocular lens: Secondary | ICD-10-CM | POA: Diagnosis not present

## 2022-10-08 DIAGNOSIS — H30033 Focal chorioretinal inflammation, peripheral, bilateral: Secondary | ICD-10-CM | POA: Diagnosis not present

## 2022-10-09 DIAGNOSIS — R42 Dizziness and giddiness: Secondary | ICD-10-CM | POA: Diagnosis not present

## 2022-10-09 DIAGNOSIS — M25532 Pain in left wrist: Secondary | ICD-10-CM | POA: Diagnosis not present

## 2022-10-09 DIAGNOSIS — M170 Bilateral primary osteoarthritis of knee: Secondary | ICD-10-CM | POA: Diagnosis not present

## 2022-10-13 DIAGNOSIS — M170 Bilateral primary osteoarthritis of knee: Secondary | ICD-10-CM | POA: Diagnosis not present

## 2022-10-13 DIAGNOSIS — R42 Dizziness and giddiness: Secondary | ICD-10-CM | POA: Diagnosis not present

## 2022-10-13 DIAGNOSIS — M25532 Pain in left wrist: Secondary | ICD-10-CM | POA: Diagnosis not present

## 2022-10-16 DIAGNOSIS — M170 Bilateral primary osteoarthritis of knee: Secondary | ICD-10-CM | POA: Diagnosis not present

## 2022-10-16 DIAGNOSIS — R42 Dizziness and giddiness: Secondary | ICD-10-CM | POA: Diagnosis not present

## 2022-10-16 DIAGNOSIS — M25532 Pain in left wrist: Secondary | ICD-10-CM | POA: Diagnosis not present

## 2022-10-20 DIAGNOSIS — R030 Elevated blood-pressure reading, without diagnosis of hypertension: Secondary | ICD-10-CM | POA: Diagnosis not present

## 2022-10-20 DIAGNOSIS — M1990 Unspecified osteoarthritis, unspecified site: Secondary | ICD-10-CM | POA: Diagnosis not present

## 2022-10-20 DIAGNOSIS — E663 Overweight: Secondary | ICD-10-CM | POA: Diagnosis not present

## 2022-10-23 DIAGNOSIS — R42 Dizziness and giddiness: Secondary | ICD-10-CM | POA: Diagnosis not present

## 2022-10-23 DIAGNOSIS — M25532 Pain in left wrist: Secondary | ICD-10-CM | POA: Diagnosis not present

## 2022-10-23 DIAGNOSIS — M170 Bilateral primary osteoarthritis of knee: Secondary | ICD-10-CM | POA: Diagnosis not present

## 2022-10-30 DIAGNOSIS — M25532 Pain in left wrist: Secondary | ICD-10-CM | POA: Diagnosis not present

## 2022-10-30 DIAGNOSIS — R42 Dizziness and giddiness: Secondary | ICD-10-CM | POA: Diagnosis not present

## 2022-10-30 DIAGNOSIS — M170 Bilateral primary osteoarthritis of knee: Secondary | ICD-10-CM | POA: Diagnosis not present

## 2022-11-03 DIAGNOSIS — R42 Dizziness and giddiness: Secondary | ICD-10-CM | POA: Diagnosis not present

## 2022-11-03 DIAGNOSIS — M170 Bilateral primary osteoarthritis of knee: Secondary | ICD-10-CM | POA: Diagnosis not present

## 2022-11-03 DIAGNOSIS — M25532 Pain in left wrist: Secondary | ICD-10-CM | POA: Diagnosis not present

## 2022-11-06 DIAGNOSIS — N390 Urinary tract infection, site not specified: Secondary | ICD-10-CM | POA: Diagnosis not present

## 2022-11-10 DIAGNOSIS — M25532 Pain in left wrist: Secondary | ICD-10-CM | POA: Diagnosis not present

## 2022-11-10 DIAGNOSIS — M170 Bilateral primary osteoarthritis of knee: Secondary | ICD-10-CM | POA: Diagnosis not present

## 2022-11-10 DIAGNOSIS — R42 Dizziness and giddiness: Secondary | ICD-10-CM | POA: Diagnosis not present

## 2022-11-13 DIAGNOSIS — M170 Bilateral primary osteoarthritis of knee: Secondary | ICD-10-CM | POA: Diagnosis not present

## 2022-11-13 DIAGNOSIS — R42 Dizziness and giddiness: Secondary | ICD-10-CM | POA: Diagnosis not present

## 2022-11-13 DIAGNOSIS — M25532 Pain in left wrist: Secondary | ICD-10-CM | POA: Diagnosis not present

## 2022-11-17 DIAGNOSIS — R42 Dizziness and giddiness: Secondary | ICD-10-CM | POA: Diagnosis not present

## 2022-11-17 DIAGNOSIS — M25532 Pain in left wrist: Secondary | ICD-10-CM | POA: Diagnosis not present

## 2022-11-17 DIAGNOSIS — M170 Bilateral primary osteoarthritis of knee: Secondary | ICD-10-CM | POA: Diagnosis not present

## 2022-11-20 DIAGNOSIS — R42 Dizziness and giddiness: Secondary | ICD-10-CM | POA: Diagnosis not present

## 2022-11-20 DIAGNOSIS — M170 Bilateral primary osteoarthritis of knee: Secondary | ICD-10-CM | POA: Diagnosis not present

## 2022-11-20 DIAGNOSIS — M25532 Pain in left wrist: Secondary | ICD-10-CM | POA: Diagnosis not present

## 2022-11-24 DIAGNOSIS — M170 Bilateral primary osteoarthritis of knee: Secondary | ICD-10-CM | POA: Diagnosis not present

## 2022-11-24 DIAGNOSIS — M25532 Pain in left wrist: Secondary | ICD-10-CM | POA: Diagnosis not present

## 2022-11-24 DIAGNOSIS — R42 Dizziness and giddiness: Secondary | ICD-10-CM | POA: Diagnosis not present

## 2022-11-27 DIAGNOSIS — M25532 Pain in left wrist: Secondary | ICD-10-CM | POA: Diagnosis not present

## 2022-11-27 DIAGNOSIS — R42 Dizziness and giddiness: Secondary | ICD-10-CM | POA: Diagnosis not present

## 2022-11-27 DIAGNOSIS — M170 Bilateral primary osteoarthritis of knee: Secondary | ICD-10-CM | POA: Diagnosis not present

## 2022-12-01 DIAGNOSIS — R42 Dizziness and giddiness: Secondary | ICD-10-CM | POA: Diagnosis not present

## 2022-12-01 DIAGNOSIS — M170 Bilateral primary osteoarthritis of knee: Secondary | ICD-10-CM | POA: Diagnosis not present

## 2022-12-01 DIAGNOSIS — M25532 Pain in left wrist: Secondary | ICD-10-CM | POA: Diagnosis not present

## 2022-12-04 DIAGNOSIS — M25532 Pain in left wrist: Secondary | ICD-10-CM | POA: Diagnosis not present

## 2022-12-04 DIAGNOSIS — M170 Bilateral primary osteoarthritis of knee: Secondary | ICD-10-CM | POA: Diagnosis not present

## 2022-12-04 DIAGNOSIS — R42 Dizziness and giddiness: Secondary | ICD-10-CM | POA: Diagnosis not present

## 2022-12-08 DIAGNOSIS — R42 Dizziness and giddiness: Secondary | ICD-10-CM | POA: Diagnosis not present

## 2022-12-08 DIAGNOSIS — M25532 Pain in left wrist: Secondary | ICD-10-CM | POA: Diagnosis not present

## 2022-12-08 DIAGNOSIS — M170 Bilateral primary osteoarthritis of knee: Secondary | ICD-10-CM | POA: Diagnosis not present

## 2022-12-09 DIAGNOSIS — R300 Dysuria: Secondary | ICD-10-CM | POA: Diagnosis not present

## 2022-12-09 DIAGNOSIS — R319 Hematuria, unspecified: Secondary | ICD-10-CM | POA: Diagnosis not present

## 2022-12-09 DIAGNOSIS — N390 Urinary tract infection, site not specified: Secondary | ICD-10-CM | POA: Diagnosis not present

## 2022-12-11 DIAGNOSIS — M25532 Pain in left wrist: Secondary | ICD-10-CM | POA: Diagnosis not present

## 2022-12-11 DIAGNOSIS — M170 Bilateral primary osteoarthritis of knee: Secondary | ICD-10-CM | POA: Diagnosis not present

## 2022-12-11 DIAGNOSIS — R42 Dizziness and giddiness: Secondary | ICD-10-CM | POA: Diagnosis not present

## 2022-12-15 DIAGNOSIS — R42 Dizziness and giddiness: Secondary | ICD-10-CM | POA: Diagnosis not present

## 2022-12-15 DIAGNOSIS — M170 Bilateral primary osteoarthritis of knee: Secondary | ICD-10-CM | POA: Diagnosis not present

## 2022-12-15 DIAGNOSIS — M25532 Pain in left wrist: Secondary | ICD-10-CM | POA: Diagnosis not present

## 2022-12-18 DIAGNOSIS — R42 Dizziness and giddiness: Secondary | ICD-10-CM | POA: Diagnosis not present

## 2022-12-18 DIAGNOSIS — M170 Bilateral primary osteoarthritis of knee: Secondary | ICD-10-CM | POA: Diagnosis not present

## 2022-12-18 DIAGNOSIS — M25532 Pain in left wrist: Secondary | ICD-10-CM | POA: Diagnosis not present

## 2022-12-22 DIAGNOSIS — M170 Bilateral primary osteoarthritis of knee: Secondary | ICD-10-CM | POA: Diagnosis not present

## 2022-12-22 DIAGNOSIS — M25532 Pain in left wrist: Secondary | ICD-10-CM | POA: Diagnosis not present

## 2022-12-22 DIAGNOSIS — R42 Dizziness and giddiness: Secondary | ICD-10-CM | POA: Diagnosis not present

## 2022-12-29 DIAGNOSIS — M170 Bilateral primary osteoarthritis of knee: Secondary | ICD-10-CM | POA: Diagnosis not present

## 2022-12-29 DIAGNOSIS — M25532 Pain in left wrist: Secondary | ICD-10-CM | POA: Diagnosis not present

## 2022-12-29 DIAGNOSIS — R42 Dizziness and giddiness: Secondary | ICD-10-CM | POA: Diagnosis not present

## 2023-01-01 DIAGNOSIS — R42 Dizziness and giddiness: Secondary | ICD-10-CM | POA: Diagnosis not present

## 2023-01-01 DIAGNOSIS — M170 Bilateral primary osteoarthritis of knee: Secondary | ICD-10-CM | POA: Diagnosis not present

## 2023-01-01 DIAGNOSIS — M25532 Pain in left wrist: Secondary | ICD-10-CM | POA: Diagnosis not present

## 2023-01-05 DIAGNOSIS — M170 Bilateral primary osteoarthritis of knee: Secondary | ICD-10-CM | POA: Diagnosis not present

## 2023-01-05 DIAGNOSIS — R42 Dizziness and giddiness: Secondary | ICD-10-CM | POA: Diagnosis not present

## 2023-01-05 DIAGNOSIS — M25532 Pain in left wrist: Secondary | ICD-10-CM | POA: Diagnosis not present

## 2023-01-08 DIAGNOSIS — R42 Dizziness and giddiness: Secondary | ICD-10-CM | POA: Diagnosis not present

## 2023-01-08 DIAGNOSIS — M25532 Pain in left wrist: Secondary | ICD-10-CM | POA: Diagnosis not present

## 2023-01-08 DIAGNOSIS — M170 Bilateral primary osteoarthritis of knee: Secondary | ICD-10-CM | POA: Diagnosis not present

## 2023-01-19 DIAGNOSIS — R42 Dizziness and giddiness: Secondary | ICD-10-CM | POA: Diagnosis not present

## 2023-01-19 DIAGNOSIS — M170 Bilateral primary osteoarthritis of knee: Secondary | ICD-10-CM | POA: Diagnosis not present

## 2023-01-19 DIAGNOSIS — M25532 Pain in left wrist: Secondary | ICD-10-CM | POA: Diagnosis not present

## 2023-01-22 DIAGNOSIS — M170 Bilateral primary osteoarthritis of knee: Secondary | ICD-10-CM | POA: Diagnosis not present

## 2023-01-22 DIAGNOSIS — R42 Dizziness and giddiness: Secondary | ICD-10-CM | POA: Diagnosis not present

## 2023-01-22 DIAGNOSIS — M25532 Pain in left wrist: Secondary | ICD-10-CM | POA: Diagnosis not present

## 2023-01-26 DIAGNOSIS — M170 Bilateral primary osteoarthritis of knee: Secondary | ICD-10-CM | POA: Diagnosis not present

## 2023-01-26 DIAGNOSIS — R42 Dizziness and giddiness: Secondary | ICD-10-CM | POA: Diagnosis not present

## 2023-01-26 DIAGNOSIS — M25532 Pain in left wrist: Secondary | ICD-10-CM | POA: Diagnosis not present

## 2023-01-29 DIAGNOSIS — M25532 Pain in left wrist: Secondary | ICD-10-CM | POA: Diagnosis not present

## 2023-01-29 DIAGNOSIS — R42 Dizziness and giddiness: Secondary | ICD-10-CM | POA: Diagnosis not present

## 2023-01-29 DIAGNOSIS — M170 Bilateral primary osteoarthritis of knee: Secondary | ICD-10-CM | POA: Diagnosis not present

## 2023-02-02 DIAGNOSIS — M170 Bilateral primary osteoarthritis of knee: Secondary | ICD-10-CM | POA: Diagnosis not present

## 2023-02-02 DIAGNOSIS — R42 Dizziness and giddiness: Secondary | ICD-10-CM | POA: Diagnosis not present

## 2023-02-02 DIAGNOSIS — M25532 Pain in left wrist: Secondary | ICD-10-CM | POA: Diagnosis not present

## 2023-02-06 DIAGNOSIS — R42 Dizziness and giddiness: Secondary | ICD-10-CM | POA: Diagnosis not present

## 2023-02-06 DIAGNOSIS — M25532 Pain in left wrist: Secondary | ICD-10-CM | POA: Diagnosis not present

## 2023-02-06 DIAGNOSIS — M170 Bilateral primary osteoarthritis of knee: Secondary | ICD-10-CM | POA: Diagnosis not present

## 2023-02-09 DIAGNOSIS — M170 Bilateral primary osteoarthritis of knee: Secondary | ICD-10-CM | POA: Diagnosis not present

## 2023-02-09 DIAGNOSIS — M25532 Pain in left wrist: Secondary | ICD-10-CM | POA: Diagnosis not present

## 2023-02-09 DIAGNOSIS — R42 Dizziness and giddiness: Secondary | ICD-10-CM | POA: Diagnosis not present

## 2023-02-12 DIAGNOSIS — M25532 Pain in left wrist: Secondary | ICD-10-CM | POA: Diagnosis not present

## 2023-02-12 DIAGNOSIS — R42 Dizziness and giddiness: Secondary | ICD-10-CM | POA: Diagnosis not present

## 2023-02-12 DIAGNOSIS — M170 Bilateral primary osteoarthritis of knee: Secondary | ICD-10-CM | POA: Diagnosis not present

## 2023-02-17 DIAGNOSIS — M170 Bilateral primary osteoarthritis of knee: Secondary | ICD-10-CM | POA: Diagnosis not present

## 2023-02-17 DIAGNOSIS — M25532 Pain in left wrist: Secondary | ICD-10-CM | POA: Diagnosis not present

## 2023-02-17 DIAGNOSIS — R42 Dizziness and giddiness: Secondary | ICD-10-CM | POA: Diagnosis not present

## 2023-02-23 DIAGNOSIS — R42 Dizziness and giddiness: Secondary | ICD-10-CM | POA: Diagnosis not present

## 2023-02-23 DIAGNOSIS — M25532 Pain in left wrist: Secondary | ICD-10-CM | POA: Diagnosis not present

## 2023-02-23 DIAGNOSIS — M170 Bilateral primary osteoarthritis of knee: Secondary | ICD-10-CM | POA: Diagnosis not present

## 2023-02-26 DIAGNOSIS — M25532 Pain in left wrist: Secondary | ICD-10-CM | POA: Diagnosis not present

## 2023-02-26 DIAGNOSIS — M170 Bilateral primary osteoarthritis of knee: Secondary | ICD-10-CM | POA: Diagnosis not present

## 2023-02-26 DIAGNOSIS — R42 Dizziness and giddiness: Secondary | ICD-10-CM | POA: Diagnosis not present

## 2023-03-02 DIAGNOSIS — M170 Bilateral primary osteoarthritis of knee: Secondary | ICD-10-CM | POA: Diagnosis not present

## 2023-03-02 DIAGNOSIS — R42 Dizziness and giddiness: Secondary | ICD-10-CM | POA: Diagnosis not present

## 2023-03-02 DIAGNOSIS — M25532 Pain in left wrist: Secondary | ICD-10-CM | POA: Diagnosis not present

## 2023-03-05 DIAGNOSIS — R42 Dizziness and giddiness: Secondary | ICD-10-CM | POA: Diagnosis not present

## 2023-03-05 DIAGNOSIS — M170 Bilateral primary osteoarthritis of knee: Secondary | ICD-10-CM | POA: Diagnosis not present

## 2023-03-05 DIAGNOSIS — M25532 Pain in left wrist: Secondary | ICD-10-CM | POA: Diagnosis not present

## 2023-03-09 DIAGNOSIS — M170 Bilateral primary osteoarthritis of knee: Secondary | ICD-10-CM | POA: Diagnosis not present

## 2023-03-09 DIAGNOSIS — M25532 Pain in left wrist: Secondary | ICD-10-CM | POA: Diagnosis not present

## 2023-03-09 DIAGNOSIS — R42 Dizziness and giddiness: Secondary | ICD-10-CM | POA: Diagnosis not present

## 2023-03-12 DIAGNOSIS — M25532 Pain in left wrist: Secondary | ICD-10-CM | POA: Diagnosis not present

## 2023-03-12 DIAGNOSIS — R42 Dizziness and giddiness: Secondary | ICD-10-CM | POA: Diagnosis not present

## 2023-03-12 DIAGNOSIS — M170 Bilateral primary osteoarthritis of knee: Secondary | ICD-10-CM | POA: Diagnosis not present

## 2023-03-16 DIAGNOSIS — R42 Dizziness and giddiness: Secondary | ICD-10-CM | POA: Diagnosis not present

## 2023-03-16 DIAGNOSIS — M170 Bilateral primary osteoarthritis of knee: Secondary | ICD-10-CM | POA: Diagnosis not present

## 2023-03-16 DIAGNOSIS — M25532 Pain in left wrist: Secondary | ICD-10-CM | POA: Diagnosis not present

## 2023-03-19 DIAGNOSIS — R42 Dizziness and giddiness: Secondary | ICD-10-CM | POA: Diagnosis not present

## 2023-03-19 DIAGNOSIS — M25532 Pain in left wrist: Secondary | ICD-10-CM | POA: Diagnosis not present

## 2023-03-19 DIAGNOSIS — M170 Bilateral primary osteoarthritis of knee: Secondary | ICD-10-CM | POA: Diagnosis not present

## 2023-03-23 DIAGNOSIS — M8580 Other specified disorders of bone density and structure, unspecified site: Secondary | ICD-10-CM | POA: Diagnosis not present

## 2023-03-23 DIAGNOSIS — R42 Dizziness and giddiness: Secondary | ICD-10-CM | POA: Diagnosis not present

## 2023-03-23 DIAGNOSIS — M25532 Pain in left wrist: Secondary | ICD-10-CM | POA: Diagnosis not present

## 2023-03-23 DIAGNOSIS — E785 Hyperlipidemia, unspecified: Secondary | ICD-10-CM | POA: Diagnosis not present

## 2023-03-23 DIAGNOSIS — R7989 Other specified abnormal findings of blood chemistry: Secondary | ICD-10-CM | POA: Diagnosis not present

## 2023-03-23 DIAGNOSIS — M170 Bilateral primary osteoarthritis of knee: Secondary | ICD-10-CM | POA: Diagnosis not present

## 2023-03-30 DIAGNOSIS — R030 Elevated blood-pressure reading, without diagnosis of hypertension: Secondary | ICD-10-CM | POA: Diagnosis not present

## 2023-03-30 DIAGNOSIS — G629 Polyneuropathy, unspecified: Secondary | ICD-10-CM | POA: Diagnosis not present

## 2023-03-30 DIAGNOSIS — R531 Weakness: Secondary | ICD-10-CM | POA: Diagnosis not present

## 2023-03-30 DIAGNOSIS — Z23 Encounter for immunization: Secondary | ICD-10-CM | POA: Diagnosis not present

## 2023-03-30 DIAGNOSIS — Z0000 Encounter for general adult medical examination without abnormal findings: Secondary | ICD-10-CM | POA: Diagnosis not present

## 2023-03-30 DIAGNOSIS — R2689 Other abnormalities of gait and mobility: Secondary | ICD-10-CM | POA: Diagnosis not present

## 2023-03-30 DIAGNOSIS — E669 Obesity, unspecified: Secondary | ICD-10-CM | POA: Diagnosis not present

## 2023-03-30 DIAGNOSIS — E785 Hyperlipidemia, unspecified: Secondary | ICD-10-CM | POA: Diagnosis not present

## 2023-03-30 DIAGNOSIS — R2681 Unsteadiness on feet: Secondary | ICD-10-CM | POA: Diagnosis not present

## 2023-03-30 DIAGNOSIS — Z1339 Encounter for screening examination for other mental health and behavioral disorders: Secondary | ICD-10-CM | POA: Diagnosis not present

## 2023-03-30 DIAGNOSIS — R82998 Other abnormal findings in urine: Secondary | ICD-10-CM | POA: Diagnosis not present

## 2023-03-30 DIAGNOSIS — Z1331 Encounter for screening for depression: Secondary | ICD-10-CM | POA: Diagnosis not present

## 2023-03-31 DIAGNOSIS — R42 Dizziness and giddiness: Secondary | ICD-10-CM | POA: Diagnosis not present

## 2023-03-31 DIAGNOSIS — M170 Bilateral primary osteoarthritis of knee: Secondary | ICD-10-CM | POA: Diagnosis not present

## 2023-03-31 DIAGNOSIS — M25532 Pain in left wrist: Secondary | ICD-10-CM | POA: Diagnosis not present

## 2023-04-02 DIAGNOSIS — M170 Bilateral primary osteoarthritis of knee: Secondary | ICD-10-CM | POA: Diagnosis not present

## 2023-04-02 DIAGNOSIS — R42 Dizziness and giddiness: Secondary | ICD-10-CM | POA: Diagnosis not present

## 2023-04-02 DIAGNOSIS — M25532 Pain in left wrist: Secondary | ICD-10-CM | POA: Diagnosis not present

## 2023-04-03 DIAGNOSIS — Z1231 Encounter for screening mammogram for malignant neoplasm of breast: Secondary | ICD-10-CM | POA: Diagnosis not present

## 2023-04-06 DIAGNOSIS — R42 Dizziness and giddiness: Secondary | ICD-10-CM | POA: Diagnosis not present

## 2023-04-06 DIAGNOSIS — M170 Bilateral primary osteoarthritis of knee: Secondary | ICD-10-CM | POA: Diagnosis not present

## 2023-04-06 DIAGNOSIS — M25532 Pain in left wrist: Secondary | ICD-10-CM | POA: Diagnosis not present

## 2023-04-10 DIAGNOSIS — N6042 Mammary duct ectasia of left breast: Secondary | ICD-10-CM | POA: Diagnosis not present

## 2023-04-10 DIAGNOSIS — N6489 Other specified disorders of breast: Secondary | ICD-10-CM | POA: Diagnosis not present

## 2023-04-10 DIAGNOSIS — R922 Inconclusive mammogram: Secondary | ICD-10-CM | POA: Diagnosis not present

## 2023-04-13 ENCOUNTER — Institutional Professional Consult (permissible substitution): Payer: Medicare Other | Admitting: Neurology

## 2023-04-13 DIAGNOSIS — M25532 Pain in left wrist: Secondary | ICD-10-CM | POA: Diagnosis not present

## 2023-04-13 DIAGNOSIS — M170 Bilateral primary osteoarthritis of knee: Secondary | ICD-10-CM | POA: Diagnosis not present

## 2023-04-13 DIAGNOSIS — R42 Dizziness and giddiness: Secondary | ICD-10-CM | POA: Diagnosis not present

## 2023-04-16 DIAGNOSIS — R42 Dizziness and giddiness: Secondary | ICD-10-CM | POA: Diagnosis not present

## 2023-04-16 DIAGNOSIS — M25532 Pain in left wrist: Secondary | ICD-10-CM | POA: Diagnosis not present

## 2023-04-16 DIAGNOSIS — M170 Bilateral primary osteoarthritis of knee: Secondary | ICD-10-CM | POA: Diagnosis not present

## 2023-04-23 DIAGNOSIS — M170 Bilateral primary osteoarthritis of knee: Secondary | ICD-10-CM | POA: Diagnosis not present

## 2023-04-23 DIAGNOSIS — M25532 Pain in left wrist: Secondary | ICD-10-CM | POA: Diagnosis not present

## 2023-04-27 DIAGNOSIS — M25532 Pain in left wrist: Secondary | ICD-10-CM | POA: Diagnosis not present

## 2023-04-27 DIAGNOSIS — M170 Bilateral primary osteoarthritis of knee: Secondary | ICD-10-CM | POA: Diagnosis not present

## 2023-04-30 DIAGNOSIS — M170 Bilateral primary osteoarthritis of knee: Secondary | ICD-10-CM | POA: Diagnosis not present

## 2023-04-30 DIAGNOSIS — M25532 Pain in left wrist: Secondary | ICD-10-CM | POA: Diagnosis not present

## 2023-05-04 DIAGNOSIS — M25532 Pain in left wrist: Secondary | ICD-10-CM | POA: Diagnosis not present

## 2023-05-04 DIAGNOSIS — M170 Bilateral primary osteoarthritis of knee: Secondary | ICD-10-CM | POA: Diagnosis not present

## 2023-05-07 DIAGNOSIS — M170 Bilateral primary osteoarthritis of knee: Secondary | ICD-10-CM | POA: Diagnosis not present

## 2023-05-07 DIAGNOSIS — M25532 Pain in left wrist: Secondary | ICD-10-CM | POA: Diagnosis not present

## 2023-05-11 DIAGNOSIS — D2271 Melanocytic nevi of right lower limb, including hip: Secondary | ICD-10-CM | POA: Diagnosis not present

## 2023-05-11 DIAGNOSIS — L218 Other seborrheic dermatitis: Secondary | ICD-10-CM | POA: Diagnosis not present

## 2023-05-11 DIAGNOSIS — D2239 Melanocytic nevi of other parts of face: Secondary | ICD-10-CM | POA: Diagnosis not present

## 2023-05-11 DIAGNOSIS — D2272 Melanocytic nevi of left lower limb, including hip: Secondary | ICD-10-CM | POA: Diagnosis not present

## 2023-05-11 DIAGNOSIS — L570 Actinic keratosis: Secondary | ICD-10-CM | POA: Diagnosis not present

## 2023-05-11 DIAGNOSIS — D225 Melanocytic nevi of trunk: Secondary | ICD-10-CM | POA: Diagnosis not present

## 2023-05-11 DIAGNOSIS — L821 Other seborrheic keratosis: Secondary | ICD-10-CM | POA: Diagnosis not present

## 2023-05-11 DIAGNOSIS — Z85828 Personal history of other malignant neoplasm of skin: Secondary | ICD-10-CM | POA: Diagnosis not present

## 2023-05-12 DIAGNOSIS — M170 Bilateral primary osteoarthritis of knee: Secondary | ICD-10-CM | POA: Diagnosis not present

## 2023-05-12 DIAGNOSIS — M25532 Pain in left wrist: Secondary | ICD-10-CM | POA: Diagnosis not present

## 2023-05-14 DIAGNOSIS — M25532 Pain in left wrist: Secondary | ICD-10-CM | POA: Diagnosis not present

## 2023-05-14 DIAGNOSIS — M170 Bilateral primary osteoarthritis of knee: Secondary | ICD-10-CM | POA: Diagnosis not present

## 2023-05-15 DIAGNOSIS — Z23 Encounter for immunization: Secondary | ICD-10-CM | POA: Diagnosis not present

## 2023-05-19 DIAGNOSIS — M170 Bilateral primary osteoarthritis of knee: Secondary | ICD-10-CM | POA: Diagnosis not present

## 2023-05-19 DIAGNOSIS — M25532 Pain in left wrist: Secondary | ICD-10-CM | POA: Diagnosis not present

## 2023-05-21 DIAGNOSIS — M170 Bilateral primary osteoarthritis of knee: Secondary | ICD-10-CM | POA: Diagnosis not present

## 2023-05-21 DIAGNOSIS — M25532 Pain in left wrist: Secondary | ICD-10-CM | POA: Diagnosis not present

## 2023-05-23 DIAGNOSIS — Z23 Encounter for immunization: Secondary | ICD-10-CM | POA: Diagnosis not present

## 2023-05-25 DIAGNOSIS — M170 Bilateral primary osteoarthritis of knee: Secondary | ICD-10-CM | POA: Diagnosis not present

## 2023-05-25 DIAGNOSIS — M25532 Pain in left wrist: Secondary | ICD-10-CM | POA: Diagnosis not present

## 2023-06-01 DIAGNOSIS — R42 Dizziness and giddiness: Secondary | ICD-10-CM | POA: Diagnosis not present

## 2023-06-01 DIAGNOSIS — H903 Sensorineural hearing loss, bilateral: Secondary | ICD-10-CM | POA: Diagnosis not present

## 2023-06-01 DIAGNOSIS — J320 Chronic maxillary sinusitis: Secondary | ICD-10-CM | POA: Diagnosis not present

## 2023-06-08 DIAGNOSIS — M25532 Pain in left wrist: Secondary | ICD-10-CM | POA: Diagnosis not present

## 2023-06-08 DIAGNOSIS — M170 Bilateral primary osteoarthritis of knee: Secondary | ICD-10-CM | POA: Diagnosis not present

## 2023-06-11 DIAGNOSIS — M25532 Pain in left wrist: Secondary | ICD-10-CM | POA: Diagnosis not present

## 2023-06-11 DIAGNOSIS — M170 Bilateral primary osteoarthritis of knee: Secondary | ICD-10-CM | POA: Diagnosis not present

## 2023-06-15 DIAGNOSIS — M25532 Pain in left wrist: Secondary | ICD-10-CM | POA: Diagnosis not present

## 2023-06-15 DIAGNOSIS — M170 Bilateral primary osteoarthritis of knee: Secondary | ICD-10-CM | POA: Diagnosis not present

## 2023-06-19 DIAGNOSIS — M25532 Pain in left wrist: Secondary | ICD-10-CM | POA: Diagnosis not present

## 2023-06-19 DIAGNOSIS — M170 Bilateral primary osteoarthritis of knee: Secondary | ICD-10-CM | POA: Diagnosis not present

## 2023-06-22 DIAGNOSIS — M25532 Pain in left wrist: Secondary | ICD-10-CM | POA: Diagnosis not present

## 2023-06-22 DIAGNOSIS — M170 Bilateral primary osteoarthritis of knee: Secondary | ICD-10-CM | POA: Diagnosis not present

## 2023-06-24 DIAGNOSIS — H26493 Other secondary cataract, bilateral: Secondary | ICD-10-CM | POA: Diagnosis not present

## 2023-06-26 DIAGNOSIS — M170 Bilateral primary osteoarthritis of knee: Secondary | ICD-10-CM | POA: Diagnosis not present

## 2023-06-26 DIAGNOSIS — M25532 Pain in left wrist: Secondary | ICD-10-CM | POA: Diagnosis not present

## 2023-06-29 DIAGNOSIS — M170 Bilateral primary osteoarthritis of knee: Secondary | ICD-10-CM | POA: Diagnosis not present

## 2023-06-29 DIAGNOSIS — M25532 Pain in left wrist: Secondary | ICD-10-CM | POA: Diagnosis not present

## 2023-07-03 DIAGNOSIS — M25532 Pain in left wrist: Secondary | ICD-10-CM | POA: Diagnosis not present

## 2023-07-03 DIAGNOSIS — M170 Bilateral primary osteoarthritis of knee: Secondary | ICD-10-CM | POA: Diagnosis not present

## 2023-07-06 DIAGNOSIS — M170 Bilateral primary osteoarthritis of knee: Secondary | ICD-10-CM | POA: Diagnosis not present

## 2023-07-06 DIAGNOSIS — M25532 Pain in left wrist: Secondary | ICD-10-CM | POA: Diagnosis not present

## 2023-07-09 DIAGNOSIS — M170 Bilateral primary osteoarthritis of knee: Secondary | ICD-10-CM | POA: Diagnosis not present

## 2023-07-09 DIAGNOSIS — M25532 Pain in left wrist: Secondary | ICD-10-CM | POA: Diagnosis not present

## 2023-07-13 DIAGNOSIS — M25532 Pain in left wrist: Secondary | ICD-10-CM | POA: Diagnosis not present

## 2023-07-13 DIAGNOSIS — M170 Bilateral primary osteoarthritis of knee: Secondary | ICD-10-CM | POA: Diagnosis not present

## 2023-07-20 ENCOUNTER — Encounter: Payer: Self-pay | Admitting: *Deleted

## 2023-07-20 DIAGNOSIS — M170 Bilateral primary osteoarthritis of knee: Secondary | ICD-10-CM | POA: Diagnosis not present

## 2023-07-20 DIAGNOSIS — M25532 Pain in left wrist: Secondary | ICD-10-CM | POA: Diagnosis not present

## 2023-07-21 ENCOUNTER — Ambulatory Visit (INDEPENDENT_AMBULATORY_CARE_PROVIDER_SITE_OTHER): Payer: Medicare Other | Admitting: Neurology

## 2023-07-21 ENCOUNTER — Encounter: Payer: Self-pay | Admitting: Neurology

## 2023-07-21 VITALS — BP 134/82 | HR 86 | Ht 64.0 in | Wt 172.0 lb

## 2023-07-21 DIAGNOSIS — M25561 Pain in right knee: Secondary | ICD-10-CM | POA: Diagnosis not present

## 2023-07-21 DIAGNOSIS — M25562 Pain in left knee: Secondary | ICD-10-CM | POA: Diagnosis not present

## 2023-07-21 DIAGNOSIS — R2689 Other abnormalities of gait and mobility: Secondary | ICD-10-CM

## 2023-07-21 DIAGNOSIS — M79672 Pain in left foot: Secondary | ICD-10-CM | POA: Diagnosis not present

## 2023-07-21 DIAGNOSIS — G8929 Other chronic pain: Secondary | ICD-10-CM | POA: Diagnosis not present

## 2023-07-21 NOTE — Patient Instructions (Signed)
I believe you have a multifactorial gait disorder, meaning, that it is Susan Bruce is due to a combination of factors. These factors include: normal aging, degenerative arthritis of your back, and knee arthritis, swelling of the feet, foot pain.   Please remember to stand up slowly and get your bearings first turn slowly, no bending down to pick anything, no heavy lifting, be extra careful at night and first thing in the morning. Also, be careful in the Bathroom and the kitchen.   Remember to drink plenty of fluid, eat healthy meals and do not skip any meals. Try to eat protein with a every meal and eat a healthy snack such as fruit or nuts or yogurt in between meals. Try to keep a regular sleep-wake schedule and try to exercise daily, particularly in the form of walking, if you can. You should consider  using a walker for gait safety.    As far as your medications are concerned, I would like to suggest no new medications.   As far as diagnostic testing: I do not suggest any new test from my end of things.   Follow up with orthopedics for your knee pain.

## 2023-07-21 NOTE — Progress Notes (Signed)
Subjective:    Patient ID: Susan Bruce is a 82 y.o. female.  HPI    Huston Foley, MD, PhD [facility] [address], [address] [address] [place], [place] [address]  Dear Dr. Jacky Kindle,  I saw your patient, Susan Bruce, upon your kind request in my neurologic clinic today for initial consultation of her gait disorder and weakness.  The patient is unaccompanied today.  As you know, [name] is an 82 year old female with an underlying medical history of skin cancer, meningioma with status post gamma knife, hearing loss, tinnitus, sleep apnea, arthritis, bronchitis, headaches, lipidemia, osteopenia, recurrent upper respiratory infections, vascular insufficiency, and overweight state, who reports difficulty with her balance and her gait for years.  She has been in physical therapy for a prolonged period of time.  She follows there exercises but does not feel that she has made much progress.  She has not had any recent falls, she has been using a cane.  She reports difficulty with both knees, has arthritis and may need repeat evaluations with orthopedics, she is to see Dr. Ranell Patrick for her knees, she may need knee replacements she states.  She tries to hydrate well.  She estimates that she drinks about 6 cups of water per day.  She drinks 2 cups of coffee in the morning, alcohol occasionally, maybe once a week in the form of a small glass of wine.  She has some low back pain but no radiating pain to the legs, has no bowel or bladder incontinence with the exception of incontinence problems when she has a UTI.  She has occasional tingling in her feet, she has occasional pains in her feet but also arthritis in her feet, she has chronic swelling in her ankles and feet, she had an ankle injury on the left, she has seen a podiatrist for left foot pain.  She was told she had arthritis.  She denies any sudden onset of one-sided weakness or numbness or tingling or droopy face or slurring of speech.    I reviewed your office note from 03/30/2023.  She had blood work through your office on 03/23/2023 and I reviewed her test results.  CMP was benign, BUN was 15, creatinine 0.8, liver enzymes normal.  CBC with differential and platelets benign.  Lipid panel showed mildly elevated total cholesterol at 219, triglycerides 89, LDL 132, TSH normal at 1.55, vitamin D low normal at 35.6.  She was referred to ENT for evaluation of her balance disorder.   She saw an ENT provider with [facility] on 06/01/2023, I reviewed the visit note.    She had a maxillofacial CT without contrast on 03/26/2022 with indication of acute sinusitis.  Left upper teeth sensitivity.  I reviewed the test results:  IMPRESSION: 1. Mild inferior left maxillary sinus mucosal thickening. Otherwise, clear sinuses. 2. Partially imaged calcified left anterior middle cranial fossa mass, better characterized on prior outside MRI from Dec 25, 2020.     She had a brain MRI with and without contrast through [facility] on 06/26/2022 with indication of meningioma.  I reviewed the results: Impression: 1.  Slight interval decrease in size of the enhancing lesion in the left middle cranial fossa, likely representing a meningioma.  2.  Similar size of the small enhancing lesion along the left porous acusticus, likely representing an additional meningioma.  3.  No evidence of new lesions.   She had a brain MRI with and without contrast through [facility] on  12/25/2020 and I reviewed the results:  Impression:   1.  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 Areas of low signal intensity centrally within the lesion likely reflect central necrosis and treatment effect.  2.  Similar size of the small enhancing lesion likely representing a meningioma along the left porous acusticus.   She had a brain MRI with and without contrast through [facility] on  05/01/2020 and I reviewed the results: Impression:  1.  Slightly increased size of presumed meningioma in the left middle cranial fossa.  2.  No significant change in size of presumed meningioma along the anterior aspect of the left porus acusticus.   She had a brain MRI with and without contrast through [facility] on 10/12/2018 and I reviewed the results: Impression:  No significant interval change in presumed meningiomas overlying the left temporal convexity and along the anterior aspect of the left porus acusticus, as further described above.   She had a brain MRI with and without contrast through [facility] on 04/22/2018 and I reviewed the results: Impression:  1.  Findings are most compatible with meningioma along the lateral aspect of the left middle cranial fossa with mild local mass effect on the adjacent temporal lobe and no subjacent parenchymal edema.   2.  Additional smaller presumed meningioma along the anterior wall of the porus acusticus of the left internal auditory canal measuring approximately 7 mm in diameter.   3.  Neurosurgical evaluation is recommended.     She has been followed by neurosurgery for her GLF through [facility].  Her last visit was on 06/28/2022, she was seen by the neurosurgery PA and was advised to follow-up in 2-1/2 years.  I had evaluated her for obstructive sleep apnea in the past.  She had mild obstructive sleep apnea in 2018.  She has not been on PAP therapy. Previously:  03/12/2017: 82 year old right-handed woman with an underlying medical history of arthritis, skin cancer, left bundle branch block, hyperlipidemia, and obesity, who presents for follow-up consultation of her sleep disorder, after recent baseline sleep study testing. The patient is unaccompanied today. I first met her on 02/19/2017 at the request of her primary care physician, at which time she reported a long-standing history of sleep difficulties including snoring and morning  headaches as well as difficulty with sleep maintenance. I invited her for a sleep study. She had a baseline sleep study on 03/01/2017. I went over her test results with her in detail today. Sleep latency was delayed at 41.5 minutes, REM latency was markedly delayed at 235.5 minutes. Sleep efficiency was reduced at 72%. She had an increased amount of wake after sleep onset at 97.5 minutes with moderate sleep fragmentation noted. She had a markedly increased percentage of stage II sleep, near absence of slow-wave sleep and a decreased percentage of REM sleep. She had snoring which range from moderate to loud mostly. She did have some PVCs on EKG. She had one bathroom break. Total AHI was 6.7 per hour, REM AHI was 20 per hour, supine AHI was 7.3 per hour, average oxygen saturation 96%, nadir was 85%, time below 89% saturation was 11 minutes for the night. She had no significant PLMS. Based on her test results and her sleep-related complaints, I suggested she return for a full night CPAP titration study. She requested a follow-up appointment to discuss.   Today, 03/12/2017: She reports doing okay, no new  symptoms, no recent medication changes. Does worry about having sleep apnea and treatment options, she would like to avoid using CPAP. She would like to learn more about alternatives. We talked about treatment options, we talked about the details of her sleep apnea and I also showed her the hip program for better visualization. She had multiple questions which I answered.     02/19/2017: (She) reports a longstanding history of sleep difficulties, including snoring and difficulty with sleep maintenance.  I reviewed your office note from 01/07/2017, which you kindly included.  She has rare morning headaches, and while she falls asleep quickly, she rarely ever stays asleep.  She does not watch TV in bed, goes to bed around 10:30, after the news, wakes up around 7:30, husband sets an alarm.  He likes to read a  little longer, and therefore she uses a sleep mask, as he has his light on. She has nocturia once per night.  She has tried OTC sleep aid, from time to time, not helpful, including melatonin. Tried Ambien one time during her hospitalization in 2004 when she had emergent AE, but it did not help at the time, but she was also quite sick at the time, had partial colectomy secondary to peritonitis, secondary to ruptured appendix. She had incisional hernia surgery in 2005. She has had knee pain bilaterally for years. She sees Dr. Ranell Patrick for this. She has had injections including cartilage injections as well as steroid injections. She is eventually looking at knee replacement surgery bilaterally as I understand. She denies telltale symptoms of restless leg syndrome. Her son had a sleep study recently and was told he had mild OSA. She has a 99 year old son and a 77 year old son. She is a nonsmoker, she drinks alcohol occasionally, 2-4 glasses per week on average, caffeine in the form of coffee, 2 months in the mornings, typically no soda or tea. She does not nap well. She has difficulty napping even if she tries. Epworth Sleepiness Scale score is 5 out of 24, fatigue score is 45 out of 63.   Her Past Medical History Is Significant For: Past Medical History:  Diagnosis Date   Arthritis    Bronchitis    PMH   Cancer (HCC)    Skin cancer- basal- leg   Headache    Hyperlipidemia    Insomnia    Meningioma (HCC)    left brain 03-2018 incidental finding.   Osteopenia    Recurrent upper respiratory infection (URI)    Sleep apnea    pt denies ( see sleep study)   Vascular insufficiency    wears TED Hose   Wears contact lenses    right eye    Her Past Surgical History Is Significant For: Past Surgical History:  Procedure Laterality Date   APPENDECTOMY     open incision   CATARACT EXTRACTION W/ INTRAOCULAR LENS IMPLANT     left eye   DILATION AND CURETTAGE OF UTERUS     HERNIA REPAIR     incisional -  after appendectomy   KNEE ARTHROSCOPY W/ MEDIAL COLLATERAL LIGAMENT (MCL) REPAIR Right    SMALL INTESTINE SURGERY     with appendectomy   WISDOM TOOTH EXTRACTION      Her Family History Is Significant For: Family History  Problem Relation Age of Onset   Aortic stenosis Mother    Atrial fibrillation Mother    Neuropathy Mother    Melanoma Father    Arthritis Father    Cancer Maternal Grandmother  Cancer Paternal Grandmother     Her Social History Is Significant For: Social History   Socioeconomic History   Marital status: Married    Spouse name: Not on file   Number of children: Not on file   Years of education: Not on file   Highest education level: Not on file  Occupational History   Not on file  Tobacco Use   Smoking status: Never   Smokeless tobacco: Never  Vaping Use   Vaping status: Never Used  Substance and Sexual Activity   Alcohol use: Yes    Alcohol/week: 5.0 standard drinks of alcohol    Types: 5 Glasses of wine per week    Comment: occasional glass of wine   Drug use: No   Sexual activity: Not on file    Comment: married  Other Topics Concern   Not on file  Social History Narrative   Not on file   Social Determinants of Health   Financial Resource Strain: Not on file  Food Insecurity: Not on file  Transportation Needs: Not on file  Physical Activity: Not on file  Stress: Not on file  Social Connections: Not on file    Her Allergies Are:  Allergies  Allergen Reactions   Ciprofloxacin    Other     Band Aid causes redness  :   Her Current Medications Are:  Outpatient Encounter Medications as of 07/21/2023  Medication Sig   Cholecalciferol (VITAMIN D3) 2000 units TABS Take 1 capsule by mouth daily.    Glucosamine HCl (GLUCOSAMINE PO) Take 1 tablet by mouth 2 (two) times daily.    Magnesium 250 MG TABS Take 250 mg by mouth daily.    Multiple Vitamins-Minerals (CENTRUM SILVER 50+WOMEN PO) Take 1 tablet by mouth daily.   Multiple  Vitamins-Minerals (PRESERVISION AREDS PO) Take 1 capsule by mouth 2 (two) times daily.    Omega-3 Fatty Acids (FISH OIL PO) Take 1,400 mg by mouth daily.    vitamin B-12 (CYANOCOBALAMIN) 1000 MCG tablet Take 1,000 mcg by mouth 2 (two) times daily.    [DISCONTINUED] OVER THE COUNTER MEDICATION Take 1 tablet by mouth daily. V-Vein Formula   No facility-administered encounter medications on file as of 07/21/2023.  :   Review of Systems:  Out of a complete 14 point review of systems, all are reviewed and negative with the exception of these symptoms as listed below:   Review of Systems  Neurological:        Pt is here for gait instability. Pt states she uses a cane move around. States has not had any falls. Pt states she does PT twice a week.     Objective:  Neurological Exam  Physical Exam Physical Examination:   Vitals:   07/21/23 1442  BP: 134/82  Pulse: 86    General Examination: The patient is a very pleasant 82 y.o. female in no acute distress. She appears well-developed and well-nourished and well groomed.   HEENT: Normocephalic, atraumatic, pupils are equal, round and reactive to light, status post cataract repairs.  Hearing grossly intact, bilateral hearing aids.  Face is symmetric with normal facial animation and normal facial sensation to light touch, temperature and vibration.  Airway examination reveals mild mouth dryness, tongue protrudes centrally and palate elevates symmetrically.    Chest: Clear to auscultation without wheezing, rhonchi or crackles noted.  Heart: S1+S2+0, regular and normal without murmurs, rubs or gallops noted.   Abdomen: Soft, non-tender and non-distended.  Extremities: There is swelling in both  lower extremities, she is wearing compression socks up to the knees bilaterally, has 2+ left ankle swelling and 1+ right ankle swelling.    Skin: Warm and dry without trophic changes noted.   Musculoskeletal: exam reveals limited range of motion both  knees, swelling both knees, limited range of motion left ankle.   Neurologically:  Mental status: The patient is awake, alert and oriented in all 4 spheres. Her immediate and remote memory, attention, language skills and fund of knowledge are appropriate. There is no evidence of aphasia, agnosia, apraxia or anomia. Speech is clear with normal prosody and enunciation. Thought process is linear. Mood is normal and affect is normal.  Cranial nerves II - XII are as described above under HEENT exam.  Motor exam: Normal bulk, strength and tone is noted. There is no obvious action or resting tremor.  Fine motor skills and coordination: Intact finger taps, hand movements and rapid alternating patting in both upper extremities, limited foot taps on the left but likely due to swelling and prior injury to the left ankle.   Cerebellar testing: No dysmetria or intention tremor. There is no truncal or gait ataxia.  Normal finger-to-nose bilaterally. Reflexes are 1+ in the upper extremities and diminished in the lower extremities.  Romberg is not tested due to safety concerns. Sensory exam: intact to light touch, temperature and vibration sense in the upper and lower extremities.  Gait, station and balance: She is ends up slowly, she pushes herself up and requires no assistance, posture is age-appropriate, she walks with a single-point cane without shuffling but walks slowly and cautiously, preserved arm swing on the left, holds the cane with the right.   Assessment and plan:   In summary, TANEE MALINSKY is a very pleasant 82 y.o.-year old female with an underlying medical history of skin cancer, meningioma with status post gamma knife, hearing loss, tinnitus, sleep apnea, arthritis, bronchitis, headaches, lipidemia, osteopenia, recurrent upper respiratory infections, vascular insufficiency, and overweight state, who presents for evaluation of her gait and balance disorder of several years duration.  She has not had any  recent falls, examination not concerning for any lateralizing findings, no evidence of parkinsonism, history not suggestive of TIA or stroke.  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  She is advised to consider using a walker and continuing with physical therapy for strengthening exercises and balance training.  She is advised to stay well-hydrated and well rested.  I do not recommend any new medications from my end of things, she had multiple serial brain MRI scans for surveillance of her meningioma for which she is followed by [facility] neurosurgery.  She is encouraged to follow-up with your office closely.  This was an extended visit of over 60 minutes with copious record review including electronic records and paper records, outside records through [facility] as well.  She is encouraged to consider seeing orthopedics again. I answered all her questions today and she was in agreement with our plan. Thank you very much for allowing me to participate in the care of this nice patient. If I can be of any further assistance to you please do not hesitate to call me at [phone].  Sincerely,   Huston Foley, MD, PhD

## 2023-07-22 DIAGNOSIS — R1030 Lower abdominal pain, unspecified: Secondary | ICD-10-CM | POA: Diagnosis not present

## 2023-07-22 DIAGNOSIS — R509 Fever, unspecified: Secondary | ICD-10-CM | POA: Diagnosis not present

## 2023-07-23 DIAGNOSIS — R140 Abdominal distension (gaseous): Secondary | ICD-10-CM | POA: Diagnosis not present

## 2023-07-23 DIAGNOSIS — R509 Fever, unspecified: Secondary | ICD-10-CM | POA: Diagnosis not present

## 2023-07-27 DIAGNOSIS — D485 Neoplasm of uncertain behavior of skin: Secondary | ICD-10-CM | POA: Diagnosis not present

## 2023-07-27 DIAGNOSIS — Z85828 Personal history of other malignant neoplasm of skin: Secondary | ICD-10-CM | POA: Diagnosis not present

## 2023-08-03 DIAGNOSIS — M170 Bilateral primary osteoarthritis of knee: Secondary | ICD-10-CM | POA: Diagnosis not present

## 2023-08-03 DIAGNOSIS — M25532 Pain in left wrist: Secondary | ICD-10-CM | POA: Diagnosis not present

## 2023-08-06 DIAGNOSIS — M25532 Pain in left wrist: Secondary | ICD-10-CM | POA: Diagnosis not present

## 2023-08-06 DIAGNOSIS — M170 Bilateral primary osteoarthritis of knee: Secondary | ICD-10-CM | POA: Diagnosis not present

## 2023-08-10 DIAGNOSIS — D1801 Hemangioma of skin and subcutaneous tissue: Secondary | ICD-10-CM | POA: Diagnosis not present

## 2023-08-10 DIAGNOSIS — Z85828 Personal history of other malignant neoplasm of skin: Secondary | ICD-10-CM | POA: Diagnosis not present

## 2023-08-13 DIAGNOSIS — M170 Bilateral primary osteoarthritis of knee: Secondary | ICD-10-CM | POA: Diagnosis not present

## 2023-08-13 DIAGNOSIS — M25532 Pain in left wrist: Secondary | ICD-10-CM | POA: Diagnosis not present

## 2023-08-17 DIAGNOSIS — M25532 Pain in left wrist: Secondary | ICD-10-CM | POA: Diagnosis not present

## 2023-08-17 DIAGNOSIS — M170 Bilateral primary osteoarthritis of knee: Secondary | ICD-10-CM | POA: Diagnosis not present

## 2023-08-20 DIAGNOSIS — M25532 Pain in left wrist: Secondary | ICD-10-CM | POA: Diagnosis not present

## 2023-08-20 DIAGNOSIS — M170 Bilateral primary osteoarthritis of knee: Secondary | ICD-10-CM | POA: Diagnosis not present

## 2023-08-26 DIAGNOSIS — M170 Bilateral primary osteoarthritis of knee: Secondary | ICD-10-CM | POA: Diagnosis not present

## 2023-08-26 DIAGNOSIS — M25532 Pain in left wrist: Secondary | ICD-10-CM | POA: Diagnosis not present

## 2023-08-28 DIAGNOSIS — H26491 Other secondary cataract, right eye: Secondary | ICD-10-CM | POA: Diagnosis not present

## 2023-08-31 DIAGNOSIS — M25532 Pain in left wrist: Secondary | ICD-10-CM | POA: Diagnosis not present

## 2023-08-31 DIAGNOSIS — M170 Bilateral primary osteoarthritis of knee: Secondary | ICD-10-CM | POA: Diagnosis not present

## 2023-09-03 DIAGNOSIS — M170 Bilateral primary osteoarthritis of knee: Secondary | ICD-10-CM | POA: Diagnosis not present

## 2023-09-03 DIAGNOSIS — M25532 Pain in left wrist: Secondary | ICD-10-CM | POA: Diagnosis not present

## 2023-09-07 DIAGNOSIS — M25532 Pain in left wrist: Secondary | ICD-10-CM | POA: Diagnosis not present

## 2023-09-07 DIAGNOSIS — M170 Bilateral primary osteoarthritis of knee: Secondary | ICD-10-CM | POA: Diagnosis not present

## 2023-09-10 DIAGNOSIS — M25532 Pain in left wrist: Secondary | ICD-10-CM | POA: Diagnosis not present

## 2023-09-10 DIAGNOSIS — M170 Bilateral primary osteoarthritis of knee: Secondary | ICD-10-CM | POA: Diagnosis not present

## 2023-09-14 DIAGNOSIS — M170 Bilateral primary osteoarthritis of knee: Secondary | ICD-10-CM | POA: Diagnosis not present

## 2023-09-14 DIAGNOSIS — M25532 Pain in left wrist: Secondary | ICD-10-CM | POA: Diagnosis not present

## 2023-09-17 DIAGNOSIS — M25532 Pain in left wrist: Secondary | ICD-10-CM | POA: Diagnosis not present

## 2023-09-17 DIAGNOSIS — M170 Bilateral primary osteoarthritis of knee: Secondary | ICD-10-CM | POA: Diagnosis not present

## 2023-09-21 DIAGNOSIS — M25532 Pain in left wrist: Secondary | ICD-10-CM | POA: Diagnosis not present

## 2023-09-21 DIAGNOSIS — M170 Bilateral primary osteoarthritis of knee: Secondary | ICD-10-CM | POA: Diagnosis not present

## 2023-09-24 DIAGNOSIS — M170 Bilateral primary osteoarthritis of knee: Secondary | ICD-10-CM | POA: Diagnosis not present

## 2023-09-24 DIAGNOSIS — M25532 Pain in left wrist: Secondary | ICD-10-CM | POA: Diagnosis not present

## 2023-09-28 DIAGNOSIS — E785 Hyperlipidemia, unspecified: Secondary | ICD-10-CM | POA: Diagnosis not present

## 2023-09-28 DIAGNOSIS — M25532 Pain in left wrist: Secondary | ICD-10-CM | POA: Diagnosis not present

## 2023-09-28 DIAGNOSIS — R030 Elevated blood-pressure reading, without diagnosis of hypertension: Secondary | ICD-10-CM | POA: Diagnosis not present

## 2023-09-28 DIAGNOSIS — M170 Bilateral primary osteoarthritis of knee: Secondary | ICD-10-CM | POA: Diagnosis not present

## 2023-09-28 DIAGNOSIS — E663 Overweight: Secondary | ICD-10-CM | POA: Diagnosis not present

## 2023-09-28 DIAGNOSIS — G629 Polyneuropathy, unspecified: Secondary | ICD-10-CM | POA: Diagnosis not present

## 2023-10-01 DIAGNOSIS — M25532 Pain in left wrist: Secondary | ICD-10-CM | POA: Diagnosis not present

## 2023-10-01 DIAGNOSIS — M170 Bilateral primary osteoarthritis of knee: Secondary | ICD-10-CM | POA: Diagnosis not present

## 2023-10-05 DIAGNOSIS — M170 Bilateral primary osteoarthritis of knee: Secondary | ICD-10-CM | POA: Diagnosis not present

## 2023-10-05 DIAGNOSIS — M25532 Pain in left wrist: Secondary | ICD-10-CM | POA: Diagnosis not present

## 2023-10-12 DIAGNOSIS — M170 Bilateral primary osteoarthritis of knee: Secondary | ICD-10-CM | POA: Diagnosis not present

## 2023-10-12 DIAGNOSIS — M25532 Pain in left wrist: Secondary | ICD-10-CM | POA: Diagnosis not present

## 2023-10-15 DIAGNOSIS — M25532 Pain in left wrist: Secondary | ICD-10-CM | POA: Diagnosis not present

## 2023-10-15 DIAGNOSIS — M170 Bilateral primary osteoarthritis of knee: Secondary | ICD-10-CM | POA: Diagnosis not present

## 2023-10-19 DIAGNOSIS — M25532 Pain in left wrist: Secondary | ICD-10-CM | POA: Diagnosis not present

## 2023-10-19 DIAGNOSIS — M170 Bilateral primary osteoarthritis of knee: Secondary | ICD-10-CM | POA: Diagnosis not present

## 2023-10-22 DIAGNOSIS — M25532 Pain in left wrist: Secondary | ICD-10-CM | POA: Diagnosis not present

## 2023-10-22 DIAGNOSIS — M170 Bilateral primary osteoarthritis of knee: Secondary | ICD-10-CM | POA: Diagnosis not present

## 2023-10-26 DIAGNOSIS — M170 Bilateral primary osteoarthritis of knee: Secondary | ICD-10-CM | POA: Diagnosis not present

## 2023-10-26 DIAGNOSIS — M25532 Pain in left wrist: Secondary | ICD-10-CM | POA: Diagnosis not present

## 2023-10-29 DIAGNOSIS — M25532 Pain in left wrist: Secondary | ICD-10-CM | POA: Diagnosis not present

## 2023-10-29 DIAGNOSIS — M170 Bilateral primary osteoarthritis of knee: Secondary | ICD-10-CM | POA: Diagnosis not present

## 2023-11-02 DIAGNOSIS — M170 Bilateral primary osteoarthritis of knee: Secondary | ICD-10-CM | POA: Diagnosis not present

## 2023-11-02 DIAGNOSIS — M25532 Pain in left wrist: Secondary | ICD-10-CM | POA: Diagnosis not present

## 2023-11-04 DIAGNOSIS — H30033 Focal chorioretinal inflammation, peripheral, bilateral: Secondary | ICD-10-CM | POA: Diagnosis not present

## 2023-11-04 DIAGNOSIS — Z9889 Other specified postprocedural states: Secondary | ICD-10-CM | POA: Diagnosis not present

## 2023-11-04 DIAGNOSIS — Z961 Presence of intraocular lens: Secondary | ICD-10-CM | POA: Diagnosis not present

## 2023-11-04 DIAGNOSIS — H04123 Dry eye syndrome of bilateral lacrimal glands: Secondary | ICD-10-CM | POA: Diagnosis not present

## 2023-11-05 DIAGNOSIS — M170 Bilateral primary osteoarthritis of knee: Secondary | ICD-10-CM | POA: Diagnosis not present

## 2023-11-05 DIAGNOSIS — M25532 Pain in left wrist: Secondary | ICD-10-CM | POA: Diagnosis not present

## 2023-11-09 DIAGNOSIS — M25532 Pain in left wrist: Secondary | ICD-10-CM | POA: Diagnosis not present

## 2023-11-09 DIAGNOSIS — M170 Bilateral primary osteoarthritis of knee: Secondary | ICD-10-CM | POA: Diagnosis not present

## 2023-11-12 DIAGNOSIS — M25532 Pain in left wrist: Secondary | ICD-10-CM | POA: Diagnosis not present

## 2023-11-12 DIAGNOSIS — M170 Bilateral primary osteoarthritis of knee: Secondary | ICD-10-CM | POA: Diagnosis not present

## 2023-11-16 DIAGNOSIS — M25532 Pain in left wrist: Secondary | ICD-10-CM | POA: Diagnosis not present

## 2023-11-16 DIAGNOSIS — M170 Bilateral primary osteoarthritis of knee: Secondary | ICD-10-CM | POA: Diagnosis not present

## 2023-11-18 NOTE — H&P (Signed)
 Patient's anticipated LOS is less than 2 midnights, meeting these requirements: - Younger than 21 - Lives within 1 hour of care - Has a competent adult at home to recover with post-op recover - NO history of  - Chronic pain requiring opiods  - Diabetes  - Coronary Artery Disease  - Heart failure  - Heart attack  - Stroke  - DVT/VTE  - Cardiac arrhythmia  - Respiratory Failure/COPD  - Renal failure  - Anemia  - Advanced Liver disease     Susan Bruce is an 83 y.o. female.    Chief Complaint: right knee pain  HPI: Pt is a 83 y.o. female complaining of right knee pain for multiple years. Pain had continually increased since the beginning. X-rays in the clinic show end-stage arthritic changes of the right knee. Pt has tried various conservative treatments which have failed to alleviate their symptoms, including injections and therapy. Various options are discussed with the patient. Risks, benefits and expectations were discussed with the patient. Patient understand the risks, benefits and expectations and wishes to proceed with surgery.   PCP:  Geoffry Paradise, MD  D/C Plans: Home  PMH: Past Medical History:  Diagnosis Date   Arthritis    Bronchitis    PMH   Cancer (HCC)    Skin cancer- basal- leg   Headache    Hyperlipidemia    Insomnia    Meningioma (HCC)    left brain 03-2018 incidental finding.   Osteopenia    Recurrent upper respiratory infection (URI)    Sleep apnea    pt denies ( see sleep study)   Vascular insufficiency    wears TED Hose   Wears contact lenses    right eye    PSH: Past Surgical History:  Procedure Laterality Date   APPENDECTOMY     open incision   CATARACT EXTRACTION W/ INTRAOCULAR LENS IMPLANT     left eye   DILATION AND CURETTAGE OF UTERUS     HERNIA REPAIR     incisional - after appendectomy   KNEE ARTHROSCOPY W/ MEDIAL COLLATERAL LIGAMENT (MCL) REPAIR Right    SMALL INTESTINE SURGERY     with appendectomy   WISDOM TOOTH  EXTRACTION      Social History:  reports that she has never smoked. She has never used smokeless tobacco. She reports current alcohol use of about 5.0 standard drinks of alcohol per week. She reports that she does not use drugs. BMI: Estimated body mass index is 29.52 kg/m as calculated from the following:   Height as of 07/21/23: 5\' 4"  (1.626 m).   Weight as of 07/21/23: 78 kg.  Lab Results  Component Value Date   ALBUMIN 3.5 01/11/2015   Diabetes: Patient does not have a diagnosis of diabetes.     Smoking Status:   reports that she has never smoked. She has never used smokeless tobacco.    Allergies:  Allergies  Allergen Reactions   Ciprofloxacin    Other     Band Aid causes redness    Medications: No current facility-administered medications for this encounter.   Current Outpatient Medications  Medication Sig Dispense Refill   Cholecalciferol (VITAMIN D3) 2000 units TABS Take 1 capsule by mouth daily.      Glucosamine HCl (GLUCOSAMINE PO) Take 1 tablet by mouth 2 (two) times daily.      Magnesium 250 MG TABS Take 250 mg by mouth daily.      Multiple Vitamins-Minerals (CENTRUM SILVER 50+WOMEN PO)  Take 1 tablet by mouth daily.     Multiple Vitamins-Minerals (PRESERVISION AREDS PO) Take 1 capsule by mouth 2 (two) times daily.      Omega-3 Fatty Acids (FISH OIL PO) Take 1,400 mg by mouth daily.      vitamin B-12 (CYANOCOBALAMIN) 1000 MCG tablet Take 1,000 mcg by mouth 2 (two) times daily.       No results found for this or any previous visit (from the past 48 hours). No results found.  ROS: Pain with rom of the right lower extremity  Physical Exam: Alert and oriented 83 y.o. female in no acute distress Cranial nerves 2-12 intact Cervical spine: full rom with no tenderness, nv intact distally Chest: active breath sounds bilaterally, no wheeze rhonchi or rales Heart: regular rate and rhythm, no murmur Abd: non tender non distended with active bowel sounds Hip is  stable with rom  Right knee painful rom with crepitus  Assessment/Plan Assessment: right knee end stage osteoarthritis  Plan:  Patient will undergo a right total knee by Dr. Ranell Patrick at [place] Risks benefits and expectations were discussed with the patient. Patient understand risks, benefits and expectations and wishes to proceed. Preoperative templating of the joint replacement has been completed, documented, and submitted to the Operating Room personnel in order to optimize intra-operative equipment management.   Alphonsa Overall PA-C, MPAS [facility] is now [employer] [address], [place], [place] [address] Phone: [phone] [email or web address] Facebook  [employer]

## 2023-11-19 DIAGNOSIS — M170 Bilateral primary osteoarthritis of knee: Secondary | ICD-10-CM | POA: Diagnosis not present

## 2023-11-19 DIAGNOSIS — M25532 Pain in left wrist: Secondary | ICD-10-CM | POA: Diagnosis not present

## 2023-11-23 DIAGNOSIS — M25532 Pain in left wrist: Secondary | ICD-10-CM | POA: Diagnosis not present

## 2023-11-23 DIAGNOSIS — M170 Bilateral primary osteoarthritis of knee: Secondary | ICD-10-CM | POA: Diagnosis not present

## 2023-11-30 DIAGNOSIS — M25532 Pain in left wrist: Secondary | ICD-10-CM | POA: Diagnosis not present

## 2023-11-30 DIAGNOSIS — M170 Bilateral primary osteoarthritis of knee: Secondary | ICD-10-CM | POA: Diagnosis not present

## 2023-11-30 NOTE — Patient Instructions (Signed)
 SURGICAL WAITING ROOM VISITATION  Patients having surgery or a procedure may have no more than 2 support people in the waiting area - these visitors may rotate.    Children under the age of 15 must have an adult with them who is not the patient.  Due to an increase in RSV and influenza rates and associated hospitalizations, children ages 34 and under may not visit patients in [facility] hospitals.  Visitors with respiratory illnesses are discouraged from visiting and should remain at home.  If the patient needs to stay at the hospital during part of their recovery, the visitor guidelines for inpatient rooms apply. Pre-op nurse will coordinate an appropriate time for 1 support person to accompany patient in pre-op.  This support person may not rotate.    Please refer to the [facility] website for the visitor guidelines for Inpatients (after your surgery is over and you are in a regular room).       Your procedure is scheduled on:  12/11/23    Report to [facility] Main Entrance    Report to admitting at   [phone]   Call this number if you have problems the morning of surgery [phone]   Do not eat food :After Midnight.   After Midnight you may have the following liquids until _ 0745_____ AM DAY OF SURGERY  Water Non-Citrus Juices (without pulp, NO RED-Apple, White grape, White cranberry) Black Coffee (NO MILK/CREAM OR CREAMERS, sugar ok)  Clear Tea (NO MILK/CREAM OR CREAMERS, sugar ok) regular and decaf                             Plain Jell-O (NO RED)                                           Fruit ices (not with fruit pulp, NO RED)                                     Popsicles (NO RED)                                                               Sports drinks like Gatorade (NO RED)                    The day of surgery:  Drink ONE (1) Pre-Surgery Clear Ensure or G2 at  0745AM  ( have completed by ) the morning of surgery. Drink in one sitting. Do not sip.  This  drink was given to you during your hospital  pre-op appointment visit. Nothing else to drink after completing the  Pre-Surgery Clear Ensure or G2.          If you have questions, please contact your surgeon's office.       Oral Hygiene is also important to reduce your risk of infection.  Remember - BRUSH YOUR TEETH THE MORNING OF SURGERY WITH YOUR REGULAR TOOTHPASTE  DENTURES WILL BE REMOVED PRIOR TO SURGERY PLEASE DO NOT APPLY "Poly grip" OR ADHESIVES!!!   Do NOT smoke after Midnight   Stop all vitamins and herbal supplements 7 days before surgery.   Take these medicines the morning of surgery with A SIP OF WATER:  none   DO NOT TAKE ANY ORAL DIABETIC MEDICATIONS DAY OF YOUR SURGERY  Bring CPAP mask and tubing day of surgery.                              You may not have any metal on your body including hair pins, jewelry, and body piercing             Do not wear make-up, lotions, powders, perfumes/cologne, or deodorant  Do not wear nail polish including gel and S&S, artificial/acrylic nails, or any other type of covering on natural nails including finger and toenails. If you have artificial nails, gel coating, etc. that needs to be removed by a nail salon please have this removed prior to surgery or surgery may need to be canceled/ delayed if the surgeon/ anesthesia feels like they are unable to be safely monitored.   Do not shave  48 hours prior to surgery.               Men may shave face and neck.   Do not bring valuables to the hospital. [facility] IS NOT             RESPONSIBLE   FOR VALUABLES.   Contacts, glasses, dentures or bridgework may not be worn into surgery.   Bring small overnight bag day of surgery.   DO NOT BRING YOUR HOME MEDICATIONS TO THE HOSPITAL. PHARMACY WILL DISPENSE MEDICATIONS LISTED ON YOUR MEDICATION LIST TO YOU DURING YOUR ADMISSION IN THE HOSPITAL!    Patients discharged on the day of surgery will not be  allowed to drive home.  Someone NEEDS to stay with you for the first 24 hours after anesthesia.   Special Instructions: Bring a copy of your healthcare power of attorney and living will documents the day of surgery if you haven't scanned them before.              Please read over the following fact sheets you were given: IF YOU HAVE QUESTIONS ABOUT YOUR PRE-OP INSTRUCTIONS PLEASE CALL [phone]   If you received a COVID test during your pre-op visit  it is requested that you wear a mask when out in public, stay away from anyone that may not be feeling well and notify your surgeon if you develop symptoms. If you test positive for Covid or have been in contact with anyone that has tested positive in the last 10 days please notify you surgeon.      Pre-operative 5 CHG Bath Instructions   You can play a key role in reducing the risk of infection after surgery. Your skin needs to be as free of germs as possible. You can reduce the number of germs on your skin by washing with CHG (chlorhexidine gluconate) soap before surgery. CHG is an antiseptic soap that kills germs and continues to kill germs even after washing.   DO NOT use if you have an allergy to chlorhexidine/CHG or antibacterial soaps. If your skin becomes reddened or irritated, stop using the CHG and notify one of our RNs at  [phone].   Please shower with the CHG soap starting 4 days before surgery using the following schedule:     Please keep in mind the following:  DO NOT shave, including legs and underarms, starting the day of your first shower.   You may shave your face at any point before/day of surgery.  Place clean sheets on your bed the day you start using CHG soap. Use a clean washcloth (not used since being washed) for each shower. DO NOT sleep with pets once you start using the CHG.   CHG Shower Instructions:  If you choose to wash your hair and private area, wash first with your normal shampoo/soap.  After you use  shampoo/soap, rinse your hair and body thoroughly to remove shampoo/soap residue.  Turn the water OFF and apply about 3 tablespoons (45 ml) of CHG soap to a CLEAN washcloth.  Apply CHG soap ONLY FROM YOUR NECK DOWN TO YOUR TOES (washing for 3-5 minutes)  DO NOT use CHG soap on face, private areas, open wounds, or sores.  Pay special attention to the area where your surgery is being performed.  If you are having back surgery, having someone wash your back for you may be helpful. Wait 2 minutes after CHG soap is applied, then you may rinse off the CHG soap.  Pat dry with a clean towel  Put on clean clothes/pajamas   If you choose to wear lotion, please use ONLY the CHG-compatible lotions on the back of this paper.     Additional instructions for the day of surgery: DO NOT APPLY any lotions, deodorants, cologne, or perfumes.   Put on clean/comfortable clothes.  Brush your teeth.  Ask your nurse before applying any prescription medications to the skin.      CHG Compatible Lotions   Aveeno Moisturizing lotion  Cetaphil Moisturizing Cream  Cetaphil Moisturizing Lotion  Clairol Herbal Essence Moisturizing Lotion, Dry Skin  Clairol Herbal Essence Moisturizing Lotion, Extra Dry Skin  Clairol Herbal Essence Moisturizing Lotion, Normal Skin  Curel Age Defying Therapeutic Moisturizing Lotion with Alpha Hydroxy  Curel Extreme Care Body Lotion  Curel Soothing Hands Moisturizing Hand Lotion  Curel Therapeutic Moisturizing Cream, Fragrance-Free  Curel Therapeutic Moisturizing Lotion, Fragrance-Free  Curel Therapeutic Moisturizing Lotion, Original Formula  Eucerin Daily Replenishing Lotion  Eucerin Dry Skin Therapy Plus Alpha Hydroxy Crme  Eucerin Dry Skin Therapy Plus Alpha Hydroxy Lotion  Eucerin Original Crme  Eucerin Original Lotion  Eucerin Plus Crme Eucerin Plus Lotion  Eucerin TriLipid Replenishing Lotion  Keri Anti-Bacterial Hand Lotion  Keri Deep Conditioning Original Lotion Dry  Skin Formula Softly Scented  Keri Deep Conditioning Original Lotion, Fragrance Free Sensitive Skin Formula  Keri Lotion Fast Absorbing Fragrance Free Sensitive Skin Formula  Keri Lotion Fast Absorbing Softly Scented Dry Skin Formula  Keri Original Lotion  Keri Skin Renewal Lotion Keri Silky Smooth Lotion  Keri Silky Smooth Sensitive Skin Lotion  Nivea Body Creamy Conditioning Oil  Nivea Body Extra Enriched Teacher, adult education Moisturizing Lotion Nivea Crme  Nivea Skin Firming Lotion  NutraDerm 30 Skin Lotion  NutraDerm Skin Lotion  NutraDerm Therapeutic Skin Cream  NutraDerm Therapeutic Skin Lotion  ProShield Protective Hand Cream  Provon moisturizing lotion

## 2023-11-30 NOTE — Progress Notes (Signed)
 Anesthesia Review:  PCP: Suan Elm  Cardiologist :  PPM/ ICD: Device Orders: Rep Notified:  Chest x-ray : EKG : Echo : Stress test: 2018  Cardiac Cath :   Activity level:  Sleep Study/ CPAP : Fasting Blood Sugar :      / Checks Blood Sugar -- times a day:    Blood Thinner/ Instructions /Last Dose: ASA / Instructions/ Last Dose :   325mg  aspirin

## 2023-12-01 ENCOUNTER — Other Ambulatory Visit: Payer: Self-pay

## 2023-12-01 ENCOUNTER — Encounter (HOSPITAL_COMMUNITY)
Admission: RE | Admit: 2023-12-01 | Discharge: 2023-12-01 | Disposition: A | Discharge status: Home / Self Care | Source: Ambulatory Visit | Attending: Orthopedic Surgery | Admitting: Orthopedic Surgery

## 2023-12-01 ENCOUNTER — Encounter (HOSPITAL_COMMUNITY): Payer: Self-pay

## 2023-12-01 VITALS — BP 175/93 | HR 88 | Temp 98.5°F | Resp 16 | Ht 64.0 in | Wt 168.0 lb

## 2023-12-01 DIAGNOSIS — M170 Bilateral primary osteoarthritis of knee: Secondary | ICD-10-CM | POA: Insufficient documentation

## 2023-12-01 DIAGNOSIS — Z79899 Other long term (current) drug therapy: Secondary | ICD-10-CM | POA: Insufficient documentation

## 2023-12-01 DIAGNOSIS — Z01810 Encounter for preprocedural cardiovascular examination: Secondary | ICD-10-CM | POA: Diagnosis present

## 2023-12-01 DIAGNOSIS — Z01818 Encounter for other preprocedural examination: Secondary | ICD-10-CM

## 2023-12-01 DIAGNOSIS — Z01812 Encounter for preprocedural laboratory examination: Secondary | ICD-10-CM | POA: Diagnosis present

## 2023-12-01 DIAGNOSIS — I447 Left bundle-branch block, unspecified: Secondary | ICD-10-CM | POA: Insufficient documentation

## 2023-12-01 HISTORY — DX: Left bundle-branch block, unspecified: I44.7

## 2023-12-01 LAB — CBC
HCT: 45.9 % (ref 36.0–46.0)
Hemoglobin: 14.9 g/dL (ref 12.0–15.0)
MCH: 30.8 pg (ref 26.0–34.0)
MCHC: 32.5 g/dL (ref 30.0–36.0)
MCV: 95 fL (ref 80.0–100.0)
Platelets: 182 10*3/uL (ref 150–400)
RBC: 4.83 MIL/uL (ref 3.87–5.11)
RDW: 12.5 % (ref 11.5–15.5)
WBC: 5.8 10*3/uL (ref 4.0–10.5)
nRBC: 0 % (ref 0.0–0.2)

## 2023-12-01 LAB — SURGICAL PCR SCREEN
MRSA, PCR: NEGATIVE
Staphylococcus aureus: NEGATIVE

## 2023-12-03 DIAGNOSIS — M170 Bilateral primary osteoarthritis of knee: Secondary | ICD-10-CM | POA: Diagnosis not present

## 2023-12-03 DIAGNOSIS — M25532 Pain in left wrist: Secondary | ICD-10-CM | POA: Diagnosis not present

## 2023-12-03 NOTE — Progress Notes (Signed)
 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 never smoker with h/o chronic LBBB, right knee OA scheduled for above procedure 12/11/2023 with Dr. Beverely Low.   Clearance received from PCP which states pt is cleared from medical and cardio standpoint.  Stress Test 01/16/2017 (Care Everywhere)   Nuclear stress EF: 66%.  There was no ST segment deviation noted during stress.  This is a low risk study.  The left ventricular ejection fraction is hyperdynamic (>65%).  1. EF 66% with normal wall motion. 2. Fixed small, mild apical septal and apical perfusion defect. Given normal wall motion, this seems most likely due to breast attenuation. No evidence for ischemia.  Low risk study.  VS: BP (!) 175/93   Pulse 88   Temp 36.9 C (Oral)   Resp 16   Ht 5\' 4"  (1.626 m)   Wt 76.2 kg   SpO2 99%   BMI 28.84 kg/m   PROVIDERS: Geoffry Paradise, MD is PCP    LABS: Labs reviewed: Acceptable for surgery. (all labs ordered are listed, but only abnormal results are displayed)  Labs Reviewed  SURGICAL PCR SCREEN  CBC     IMAGES:   EKG:   CV:  Past Medical History:  Diagnosis Date   Arthritis    Bronchitis    PMH   Cancer (HCC)    Skin cancer- basal- leg   Hyperlipidemia    Insomnia    LBBB (left bundle branch block)    hx of per pt at preop on 12/01/23.   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   Osteopenia    Recurrent upper respiratory infection (URI)    Vascular insufficiency    wears TED Hose   Wears contact lenses    right eye    Past Surgical History:  Procedure Laterality Date    right eye cataract surgery      APPENDECTOMY     open incision    CATARACT EXTRACTION W/ INTRAOCULAR LENS IMPLANT     left eye   DILATION AND CURETTAGE OF UTERUS     HERNIA REPAIR     incisional - after appendectomy   KNEE ARTHROSCOPY W/ MEDIAL COLLATERAL LIGAMENT (MCL) REPAIR Right    left wrist surgery      SMALL INTESTINE SURGERY     with appendectomy   WISDOM TOOTH EXTRACTION      MEDICATIONS:  acetaminophen (TYLENOL) 500 MG tablet   aspirin EC 325 MG tablet   Carboxymethylcell-Glycerin PF (REFRESH TEARS PF) 0.5-0.9 % SOLN   Cholecalciferol (VITAMIN D3) 2000 units TABS   Glucosamine HCl-MSM (GLUCOSAMINE-MSM PO)   Magnesium 250 MG TABS   Multiple Vitamin (MULTIVITAMIN WITH MINERALS) TABS tablet   Multiple Vitamins-Minerals (PRESERVISION AREDS 2 PO)   neomycin-bacitracin-polymyxin [id number] OINT   Omega-3 Fatty Acids (FISH OIL PO)   vitamin B-12 (CYANOCOBALAMIN) 1000 MCG tablet   No current facility-administered medications for this encounter.   Jodell Cipro Ward, PA-C [facility] Pre-Surgical Testing [phone]

## 2023-12-07 DIAGNOSIS — M25532 Pain in left wrist: Secondary | ICD-10-CM | POA: Diagnosis not present

## 2023-12-07 DIAGNOSIS — M170 Bilateral primary osteoarthritis of knee: Secondary | ICD-10-CM | POA: Diagnosis not present

## 2023-12-10 NOTE — Anesthesia Preprocedure Evaluation (Signed)
 Anesthesia Evaluation  Patient identified by MRN, date of birth, ID band Patient awake    Reviewed: Allergy & Precautions, NPO status , Patient's Chart, lab work & pertinent test results  History of Anesthesia Complications Negative for: history of anesthetic complications  Airway Mallampati: II  TM Distance: >3 FB Neck ROM: Full    Dental no notable dental hx. (+) Teeth Intact, Dental Advisory Given   Pulmonary neg pulmonary ROS, sleep apnea    Pulmonary exam normal breath sounds clear to auscultation       Cardiovascular (-) hypertension(-) angina (-) Past MI Normal cardiovascular exam+ dysrhythmias (LBBB)  Rhythm:Regular Rate:Normal     Neuro/Psych S/p meningioma  negative neurological ROS  negative psych ROS   GI/Hepatic Neg liver ROS,,,  Endo/Other    Renal/GU      Musculoskeletal  (+) Arthritis , Osteoarthritis,    Abdominal   Peds  Hematology Lab Results      Component                Value               Date                      WBC                      5.8                 12/01/2023                HGB                      14.9                12/01/2023                HCT                      45.9                12/01/2023                MCV                      95.0                12/01/2023                PLT                      182                 12/01/2023              Anesthesia Other Findings All: cipro  Reproductive/Obstetrics                             Anesthesia Physical Anesthesia Plan  ASA: 3  Anesthesia Plan: Regional and General   Post-op Pain Management: Regional block* and Ofirmev  IV (intra-op)*   Induction:   PONV Risk Score and Plan: 2 and Treatment may vary due to age or medical condition, Ondansetron , Propofol  infusion and TIVA  Airway Management Planned: LMA  Additional Equipment: None  Intra-op Plan:   Post-operative Plan:   Informed  Consent: I have reviewed the patients History and Physical,  chart, labs and discussed the procedure including the risks, benefits and alternatives for the proposed anesthesia with the patient or authorized representative who has indicated his/her understanding and acceptance.     Dental advisory given  Plan Discussed with: CRNA and Surgeon  Anesthesia Plan Comments: (Spinal W R adductor)       Anesthesia Quick Evaluation

## 2023-12-11 ENCOUNTER — Ambulatory Visit (HOSPITAL_COMMUNITY): Payer: Self-pay | Admitting: Anesthesiology

## 2023-12-11 ENCOUNTER — Ambulatory Visit (HOSPITAL_COMMUNITY): Payer: Self-pay | Admitting: Physician Assistant

## 2023-12-11 ENCOUNTER — Other Ambulatory Visit: Payer: Self-pay

## 2023-12-11 ENCOUNTER — Encounter (HOSPITAL_COMMUNITY): Payer: Self-pay | Admitting: Orthopedic Surgery

## 2023-12-11 ENCOUNTER — Encounter (HOSPITAL_COMMUNITY)
Admission: RE | Disposition: A | Discharge status: Home / Self Care | Payer: Self-pay | Source: Ambulatory Visit | Attending: Orthopedic Surgery

## 2023-12-11 ENCOUNTER — Observation Stay (HOSPITAL_COMMUNITY)
Admission: RE | Admit: 2023-12-11 | Discharge: 2023-12-13 | Disposition: A | Discharge status: Home / Self Care | Payer: PRIVATE HEALTH INSURANCE | Source: Ambulatory Visit | Attending: Orthopedic Surgery | Admitting: Orthopedic Surgery

## 2023-12-11 DIAGNOSIS — G4733 Obstructive sleep apnea (adult) (pediatric): Secondary | ICD-10-CM

## 2023-12-11 DIAGNOSIS — Z79899 Other long term (current) drug therapy: Secondary | ICD-10-CM | POA: Diagnosis not present

## 2023-12-11 DIAGNOSIS — Z85828 Personal history of other malignant neoplasm of skin: Secondary | ICD-10-CM | POA: Diagnosis not present

## 2023-12-11 DIAGNOSIS — G8918 Other acute postprocedural pain: Secondary | ICD-10-CM | POA: Diagnosis not present

## 2023-12-11 DIAGNOSIS — E785 Hyperlipidemia, unspecified: Secondary | ICD-10-CM | POA: Diagnosis not present

## 2023-12-11 DIAGNOSIS — Z96651 Presence of right artificial knee joint: Principal | ICD-10-CM

## 2023-12-11 DIAGNOSIS — M1711 Unilateral primary osteoarthritis, right knee: Secondary | ICD-10-CM | POA: Diagnosis not present

## 2023-12-11 DIAGNOSIS — Z01818 Encounter for other preprocedural examination: Secondary | ICD-10-CM

## 2023-12-11 DIAGNOSIS — G4730 Sleep apnea, unspecified: Secondary | ICD-10-CM | POA: Diagnosis not present

## 2023-12-11 HISTORY — PX: TOTAL KNEE ARTHROPLASTY: SHX125

## 2023-12-11 SURGERY — ARTHROPLASTY, KNEE, TOTAL
Anesthesia: Regional | Site: Knee | Laterality: Right

## 2023-12-11 MED ORDER — WATER FOR IRRIGATION, STERILE IR SOLN
Status: DC | PRN
  Administered 2023-12-11: 1000 mL

## 2023-12-11 MED ORDER — LIDOCAINE HCL (CARDIAC) PF 100 MG/5ML IV SOSY
PREFILLED_SYRINGE | INTRAVENOUS | Status: DC | PRN
  Administered 2023-12-11: 100 mg via INTRATRACHEAL

## 2023-12-11 MED ORDER — SODIUM CHLORIDE 0.9 % IR SOLN
Status: DC | PRN
  Administered 2023-12-11: 1000 mL

## 2023-12-11 MED ORDER — SODIUM CHLORIDE (PF) 0.9 % IJ SOLN
INTRAMUSCULAR | Status: DC | PRN
  Administered 2023-12-11: 80 mL via INTRAMUSCULAR

## 2023-12-11 MED ORDER — METHOCARBAMOL 500 MG PO TABS: 500.0000 mg | ORAL_TABLET | Freq: Three times a day (TID) | ORAL | 1 refills | Status: AC | PRN

## 2023-12-11 MED ORDER — ONDANSETRON HCL 4 MG/2ML IJ SOLN
4.0000 mg | Freq: Four times a day (QID) | INTRAMUSCULAR | Status: DC | PRN
  Administered 2023-12-11: 4 mg via INTRAVENOUS
  Filled 2023-12-11: qty 2

## 2023-12-11 MED ORDER — CEFAZOLIN SODIUM-DEXTROSE 2-4 GM/100ML-% IV SOLN
2.0000 g | INTRAVENOUS | Status: AC
  Administered 2023-12-11: 2 g via INTRAVENOUS
  Filled 2023-12-11: qty 100

## 2023-12-11 MED ORDER — PHENYLEPHRINE 80 MCG/ML (10ML) SYRINGE FOR IV PUSH (FOR BLOOD PRESSURE SUPPORT)
PREFILLED_SYRINGE | INTRAVENOUS | Status: AC
  Filled 2023-12-11: qty 10

## 2023-12-11 MED ORDER — ASPIRIN 81 MG PO CHEW
81.0000 mg | CHEWABLE_TABLET | Freq: Two times a day (BID) | ORAL | Status: DC
  Administered 2023-12-11 – 2023-12-13 (×4): 81 mg via ORAL
  Filled 2023-12-11 (×4): qty 1

## 2023-12-11 MED ORDER — DEXAMETHASONE SODIUM PHOSPHATE 10 MG/ML IJ SOLN
INTRAMUSCULAR | Status: AC
  Filled 2023-12-11: qty 1

## 2023-12-11 MED ORDER — CEFAZOLIN SODIUM-DEXTROSE 2-4 GM/100ML-% IV SOLN
2.0000 g | Freq: Four times a day (QID) | INTRAVENOUS | Status: AC
  Administered 2023-12-11 – 2023-12-12 (×2): 2 g via INTRAVENOUS
  Filled 2023-12-11 (×2): qty 100

## 2023-12-11 MED ORDER — METHOCARBAMOL 500 MG PO TABS
500.0000 mg | ORAL_TABLET | Freq: Four times a day (QID) | ORAL | Status: DC | PRN
  Administered 2023-12-11 – 2023-12-13 (×4): 500 mg via ORAL
  Filled 2023-12-11 (×4): qty 1

## 2023-12-11 MED ORDER — FENTANYL CITRATE (PF) 100 MCG/2ML IJ SOLN
INTRAMUSCULAR | Status: DC | PRN
  Administered 2023-12-11 (×2): 50 ug via INTRAVENOUS

## 2023-12-11 MED ORDER — 0.9 % SODIUM CHLORIDE (POUR BTL) OPTIME
TOPICAL | Status: DC | PRN
  Administered 2023-12-11: 1000 mL

## 2023-12-11 MED ORDER — SODIUM CHLORIDE (PF) 0.9 % IJ SOLN
INTRAMUSCULAR | Status: AC
  Filled 2023-12-11: qty 30

## 2023-12-11 MED ORDER — CARBOXYMETHYLCELL-GLYCERIN PF 0.5-0.9 % OP SOLN: 1.0000 [drp] | Freq: Three times a day (TID) | OPHTHALMIC | Status: DC

## 2023-12-11 MED ORDER — ASPIRIN 81 MG PO CHEW
81.0000 mg | CHEWABLE_TABLET | Freq: Two times a day (BID) | ORAL | 0 refills | Status: AC
Start: 2023-12-11 — End: 2024-01-10

## 2023-12-11 MED ORDER — PROPOFOL 1000 MG/100ML IV EMUL
INTRAVENOUS | Status: AC
  Filled 2023-12-11: qty 100

## 2023-12-11 MED ORDER — BUPIVACAINE LIPOSOME 1.3 % IJ SUSP: 20.0000 mL | Freq: Once | INTRAMUSCULAR | Status: DC

## 2023-12-11 MED ORDER — LACTATED RINGERS IV SOLN: INTRAVENOUS | Status: DC

## 2023-12-11 MED ORDER — CHLORHEXIDINE GLUCONATE 0.12 % MT SOLN
15.0000 mL | Freq: Once | OROMUCOSAL | Status: AC
  Administered 2023-12-11: 15 mL via OROMUCOSAL

## 2023-12-11 MED ORDER — ORAL CARE MOUTH RINSE: 15.0000 mL | Freq: Once | OROMUCOSAL | Status: AC

## 2023-12-11 MED ORDER — FENTANYL CITRATE PF 50 MCG/ML IJ SOSY
PREFILLED_SYRINGE | INTRAMUSCULAR | Status: AC
  Administered 2023-12-11: 50 ug via INTRAVENOUS
  Filled 2023-12-11: qty 1

## 2023-12-11 MED ORDER — METOCLOPRAMIDE HCL 5 MG PO TABS: 5.0000 mg | ORAL_TABLET | Freq: Three times a day (TID) | ORAL | Status: DC | PRN

## 2023-12-11 MED ORDER — ONDANSETRON HCL 4 MG PO TABS: 4.0000 mg | ORAL_TABLET | Freq: Three times a day (TID) | ORAL | 1 refills | Status: AC | PRN

## 2023-12-11 MED ORDER — DOCUSATE SODIUM 100 MG PO CAPS
100.0000 mg | ORAL_CAPSULE | Freq: Two times a day (BID) | ORAL | Status: DC
  Administered 2023-12-11 – 2023-12-13 (×4): 100 mg via ORAL
  Filled 2023-12-11 (×4): qty 1

## 2023-12-11 MED ORDER — HYDROMORPHONE HCL 1 MG/ML IJ SOLN
0.5000 mg | INTRAMUSCULAR | Status: DC | PRN
Start: 2023-12-11 — End: 2023-12-13

## 2023-12-11 MED ORDER — SODIUM CHLORIDE 0.9% FLUSH: 3.0000 mL | INTRAVENOUS | Status: DC | PRN

## 2023-12-11 MED ORDER — MAGNESIUM OXIDE -MG SUPPLEMENT 400 (240 MG) MG PO TABS
200.0000 mg | ORAL_TABLET | Freq: Every day | ORAL | Status: DC
  Administered 2023-12-12 – 2023-12-13 (×2): 200 mg via ORAL
  Filled 2023-12-11 (×2): qty 1

## 2023-12-11 MED ORDER — ACETAMINOPHEN 10 MG/ML IV SOLN
INTRAVENOUS | Status: AC
  Filled 2023-12-11: qty 100

## 2023-12-11 MED ORDER — MENTHOL 3 MG MT LOZG: 1.0000 | LOZENGE | OROMUCOSAL | Status: DC | PRN

## 2023-12-11 MED ORDER — FENTANYL CITRATE PF 50 MCG/ML IJ SOSY
100.0000 ug | PREFILLED_SYRINGE | Freq: Once | INTRAMUSCULAR | Status: AC
  Administered 2023-12-11: 50 ug via INTRAVENOUS
  Filled 2023-12-11: qty 2

## 2023-12-11 MED ORDER — ONDANSETRON HCL 4 MG/2ML IJ SOLN
INTRAMUSCULAR | Status: AC
  Filled 2023-12-11: qty 2

## 2023-12-11 MED ORDER — MIDAZOLAM HCL 2 MG/2ML IJ SOLN
2.0000 mg | Freq: Once | INTRAMUSCULAR | Status: DC
  Filled 2023-12-11: qty 2

## 2023-12-11 MED ORDER — DEXMEDETOMIDINE HCL IN NACL 80 MCG/20ML IV SOLN
INTRAVENOUS | Status: DC | PRN
  Administered 2023-12-11: 8 ug via INTRAVENOUS

## 2023-12-11 MED ORDER — HYDROMORPHONE HCL 1 MG/ML IJ SOLN
INTRAMUSCULAR | Status: AC
  Administered 2023-12-11: 0.4 mg via INTRAVENOUS
  Filled 2023-12-11: qty 1

## 2023-12-11 MED ORDER — POVIDONE-IODINE 10 % EX SWAB: 2.0000 | Freq: Once | CUTANEOUS | Status: DC

## 2023-12-11 MED ORDER — VITAMIN B-12 1000 MCG PO TABS
1000.0000 ug | ORAL_TABLET | Freq: Two times a day (BID) | ORAL | Status: DC
  Administered 2023-12-11 – 2023-12-13 (×4): 1000 ug via ORAL
  Filled 2023-12-11 (×4): qty 1

## 2023-12-11 MED ORDER — TRIPLE ANTIBIOTIC 3.5-400-5000 EX OINT: 1.0000 | TOPICAL_OINTMENT | CUTANEOUS | Status: DC | PRN

## 2023-12-11 MED ORDER — TRANEXAMIC ACID-NACL 1000-0.7 MG/100ML-% IV SOLN
1000.0000 mg | INTRAVENOUS | Status: DC
  Filled 2023-12-11: qty 100

## 2023-12-11 MED ORDER — PHENOL 1.4 % MT LIQD: 1.0000 | OROMUCOSAL | Status: DC | PRN

## 2023-12-11 MED ORDER — METOCLOPRAMIDE HCL 5 MG/ML IJ SOLN: 5.0000 mg | Freq: Three times a day (TID) | INTRAMUSCULAR | Status: DC | PRN

## 2023-12-11 MED ORDER — TRANEXAMIC ACID-NACL 1000-0.7 MG/100ML-% IV SOLN
1000.0000 mg | Freq: Once | INTRAVENOUS | Status: AC
  Administered 2023-12-11: 1000 mg via INTRAVENOUS
  Filled 2023-12-11: qty 100

## 2023-12-11 MED ORDER — FENTANYL CITRATE PF 50 MCG/ML IJ SOSY
25.0000 ug | PREFILLED_SYRINGE | INTRAMUSCULAR | Status: DC | PRN
  Administered 2023-12-11: 50 ug via INTRAVENOUS

## 2023-12-11 MED ORDER — OXYCODONE HCL 5 MG PO TABS
5.0000 mg | ORAL_TABLET | ORAL | Status: DC | PRN
  Administered 2023-12-11 – 2023-12-12 (×2): 5 mg via ORAL
  Administered 2023-12-12: 10 mg via ORAL
  Administered 2023-12-12: 5 mg via ORAL
  Filled 2023-12-11 (×2): qty 1
  Filled 2023-12-11: qty 2
  Filled 2023-12-11: qty 1

## 2023-12-11 MED ORDER — ACETAMINOPHEN 500 MG PO TABS
500.0000 mg | ORAL_TABLET | Freq: Four times a day (QID) | ORAL | Status: DC | PRN
  Administered 2023-12-12: 500 mg via ORAL
  Administered 2023-12-13: 1000 mg via ORAL
  Filled 2023-12-11: qty 1
  Filled 2023-12-11: qty 2

## 2023-12-11 MED ORDER — ONDANSETRON HCL 4 MG/2ML IJ SOLN: 4.0000 mg | Freq: Once | INTRAMUSCULAR | Status: DC | PRN

## 2023-12-11 MED ORDER — PROPOFOL 10 MG/ML IV BOLUS
INTRAVENOUS | Status: DC | PRN
  Administered 2023-12-11: 170 ug/kg/min via INTRAVENOUS
  Administered 2023-12-11: 50 mg via INTRAVENOUS
  Administered 2023-12-11: 100 mg via INTRAVENOUS

## 2023-12-11 MED ORDER — LACTATED RINGERS IV SOLN: INTRAVENOUS | Status: DC | PRN

## 2023-12-11 MED ORDER — METHOCARBAMOL 1000 MG/10ML IJ SOLN: 500.0000 mg | Freq: Four times a day (QID) | INTRAMUSCULAR | Status: DC | PRN

## 2023-12-11 MED ORDER — ACETAMINOPHEN 325 MG PO TABS
325.0000 mg | ORAL_TABLET | Freq: Four times a day (QID) | ORAL | Status: DC | PRN
  Administered 2023-12-12: 650 mg via ORAL
  Filled 2023-12-11: qty 2

## 2023-12-11 MED ORDER — TRANEXAMIC ACID-NACL 1000-0.7 MG/100ML-% IV SOLN
INTRAVENOUS | Status: DC | PRN
  Administered 2023-12-11: 1000 mg via INTRAVENOUS

## 2023-12-11 MED ORDER — SODIUM CHLORIDE 0.9% FLUSH
3.0000 mL | Freq: Two times a day (BID) | INTRAVENOUS | Status: DC
  Administered 2023-12-11 – 2023-12-13 (×4): 10 mL via INTRAVENOUS

## 2023-12-11 MED ORDER — ACETAMINOPHEN 10 MG/ML IV SOLN
1000.0000 mg | Freq: Once | INTRAVENOUS | Status: DC | PRN
  Administered 2023-12-11: 1000 mg via INTRAVENOUS

## 2023-12-11 MED ORDER — VITAMIN D 25 MCG (1000 UNIT) PO TABS
2000.0000 [IU] | ORAL_TABLET | Freq: Every day | ORAL | Status: DC
  Administered 2023-12-12 – 2023-12-13 (×2): 2000 [IU] via ORAL
  Filled 2023-12-11 (×4): qty 2

## 2023-12-11 MED ORDER — BUPIVACAINE LIPOSOME 1.3 % IJ SUSP
INTRAMUSCULAR | Status: AC
  Filled 2023-12-11: qty 20

## 2023-12-11 MED ORDER — FENTANYL CITRATE (PF) 100 MCG/2ML IJ SOLN
INTRAMUSCULAR | Status: AC
  Filled 2023-12-11: qty 2

## 2023-12-11 MED ORDER — LIDOCAINE HCL (PF) 2 % IJ SOLN
INTRAMUSCULAR | Status: AC
  Filled 2023-12-11: qty 5

## 2023-12-11 MED ORDER — HYDROMORPHONE HCL 1 MG/ML IJ SOLN
0.2500 mg | INTRAMUSCULAR | Status: DC | PRN
  Administered 2023-12-11: 0.4 mg via INTRAVENOUS
  Administered 2023-12-11: 0.2 mg via INTRAVENOUS

## 2023-12-11 MED ORDER — POLYVINYL ALCOHOL 1.4 % OP SOLN
1.0000 [drp] | Freq: Three times a day (TID) | OPHTHALMIC | Status: DC
  Administered 2023-12-11 – 2023-12-12 (×2): 1 [drp] via OPHTHALMIC
  Filled 2023-12-11: qty 15

## 2023-12-11 MED ORDER — ONDANSETRON HCL 4 MG/2ML IJ SOLN
INTRAMUSCULAR | Status: DC | PRN
  Administered 2023-12-11: 4 mg via INTRAVENOUS

## 2023-12-11 MED ORDER — BUPIVACAINE-EPINEPHRINE (PF) 0.25% -1:200000 IJ SOLN
INTRAMUSCULAR | Status: AC
  Filled 2023-12-11: qty 30

## 2023-12-11 MED ORDER — DEXAMETHASONE SODIUM PHOSPHATE 10 MG/ML IJ SOLN
INTRAMUSCULAR | Status: DC | PRN
  Administered 2023-12-11: 8 mg via INTRAVENOUS

## 2023-12-11 MED ORDER — OXYCODONE-ACETAMINOPHEN 5-325 MG PO TABS: 1.0000 | ORAL_TABLET | ORAL | 0 refills | Status: AC | PRN

## 2023-12-11 MED ORDER — ADULT MULTIVITAMIN W/MINERALS CH
1.0000 | ORAL_TABLET | Freq: Every day | ORAL | Status: DC
  Administered 2023-12-12 – 2023-12-13 (×2): 1 via ORAL
  Filled 2023-12-11 (×2): qty 1

## 2023-12-11 MED ORDER — ONDANSETRON HCL 4 MG PO TABS: 4.0000 mg | ORAL_TABLET | Freq: Four times a day (QID) | ORAL | Status: DC | PRN

## 2023-12-11 MED ORDER — POLYETHYLENE GLYCOL 3350 17 G PO PACK: 17.0000 g | PACK | Freq: Every day | ORAL | Status: DC | PRN

## 2023-12-11 MED ORDER — MAGNESIUM 250 MG PO TABS
250.0000 mg | ORAL_TABLET | Freq: Every day | ORAL | Status: DC
Start: 2023-12-11 — End: 2023-12-11

## 2023-12-11 MED ORDER — FENTANYL CITRATE PF 50 MCG/ML IJ SOSY
PREFILLED_SYRINGE | INTRAMUSCULAR | Status: AC
  Administered 2023-12-11: 50 ug via INTRAVENOUS
  Filled 2023-12-11: qty 2

## 2023-12-11 SURGICAL SUPPLY — 48 items
ATTUNE MED DOME PAT 38 KNEE (Knees) IMPLANT
ATTUNE PS FEM RT SZ 5 CEM KNEE (Femur) IMPLANT
ATTUNE PSRP INSE SZ5 7 KNEE (Insert) IMPLANT
BAG COUNTER SPONGE SURGICOUNT (BAG) IMPLANT
BAG ZIPLOCK 12X15 (MISCELLANEOUS) IMPLANT
BASE TIBIAL ROT PLAT SZ 5 KNEE (Knees) IMPLANT
BLADE SAG 18X100X1.27 (BLADE) ×1 IMPLANT
BLADE SAW SGTL 13X75X1.27 (BLADE) ×1 IMPLANT
BNDG ELASTIC 6X10 VLCR STRL LF (GAUZE/BANDAGES/DRESSINGS) ×1 IMPLANT
BNDG GAUZE DERMACEA FLUFF 4 (GAUZE/BANDAGES/DRESSINGS) ×1 IMPLANT
BOWL SMART MIX CTS (DISPOSABLE) ×1 IMPLANT
CEMENT HV SMART SET (Cement) ×2 IMPLANT
COVER SURGICAL LIGHT HANDLE (MISCELLANEOUS) ×1 IMPLANT
CUFF TRNQT CYL 34X4.125X (TOURNIQUET CUFF) ×1 IMPLANT
DRAPE INCISE IOBAN 66X45 STRL (DRAPES) IMPLANT
DRAPE SHEET LG 3/4 BI-LAMINATE (DRAPES) ×1 IMPLANT
DRAPE U-SHAPE 47X51 STRL (DRAPES) ×1 IMPLANT
DRSG ADAPTIC 3X8 NADH LF (GAUZE/BANDAGES/DRESSINGS) ×1 IMPLANT
DRSG AQUACEL AG ADV 3.5X10 (GAUZE/BANDAGES/DRESSINGS) IMPLANT
DURAPREP 26ML APPLICATOR (WOUND CARE) ×1 IMPLANT
ELECT PENCIL ROCKER SW 15FT (MISCELLANEOUS) ×1 IMPLANT
ELECT REM PT RETURN 15FT ADLT (MISCELLANEOUS) ×1 IMPLANT
GAUZE PAD ABD 8X10 STRL (GAUZE/BANDAGES/DRESSINGS) ×1 IMPLANT
GAUZE SPONGE 4X4 12PLY STRL (GAUZE/BANDAGES/DRESSINGS) ×1 IMPLANT
GLOVE BIOGEL PI IND STRL 7.5 (GLOVE) ×1 IMPLANT
GLOVE BIOGEL PI IND STRL 8.5 (GLOVE) ×1 IMPLANT
GLOVE ORTHO TXT STRL SZ7.5 (GLOVE) ×1 IMPLANT
GLOVE SURG ORTHO 8.5 STRL (GLOVE) ×1 IMPLANT
GOWN STRL REUS W/ TWL XL LVL3 (GOWN DISPOSABLE) ×2 IMPLANT
HANDPIECE INTERPULSE COAX TIP (DISPOSABLE) IMPLANT
IMMOBILIZER KNEE 20 (SOFTGOODS) ×1 IMPLANT
IMMOBILIZER KNEE 20 THIGH 36 (SOFTGOODS) ×1 IMPLANT
KIT TURNOVER KIT A (KITS) IMPLANT
MANIFOLD NEPTUNE II (INSTRUMENTS) ×1 IMPLANT
NS IRRIG 1000ML POUR BTL (IV SOLUTION) ×1 IMPLANT
PACK TOTAL KNEE CUSTOM (KITS) ×1 IMPLANT
PIN STEINMAN FIXATION KNEE (PIN) IMPLANT
PROTECTOR NERVE ULNAR (MISCELLANEOUS) ×1 IMPLANT
SET HNDPC FAN SPRY TIP SCT (DISPOSABLE) ×1 IMPLANT
STRIP CLOSURE SKIN 1/2X4 (GAUZE/BANDAGES/DRESSINGS) ×2 IMPLANT
SUT MNCRL AB 3-0 PS2 18 (SUTURE) ×1 IMPLANT
SUT VIC AB 0 CT1 36 (SUTURE) ×2 IMPLANT
SUT VIC AB 1 CT1 36 (SUTURE) ×2 IMPLANT
SUT VIC AB 2-0 CT1 TAPERPNT 27 (SUTURE) ×2 IMPLANT
TOWEL GREEN STERILE (TOWEL DISPOSABLE) IMPLANT
TRAY CATH INTERMITTENT SS 16FR (CATHETERS) ×1 IMPLANT
WATER STERILE IRR 1000ML POUR (IV SOLUTION) ×2 IMPLANT
YANKAUER SUCT BULB TIP NO VENT (SUCTIONS) ×1 IMPLANT

## 2023-12-11 NOTE — Progress Notes (Signed)
 Orthopedic Tech Progress Note Patient Details:  Susan Bruce 29-Mar-1941 [id number]  Ortho Devices Type of Ortho Device: Bone foam zero knee Ortho Device/Splint Location: RLE Ortho Device/Splint Interventions: Application   Post Interventions Patient Tolerated: Well  Susan Bruce Kambry Takacs 12/11/2023, 2:41 PM

## 2023-12-11 NOTE — Anesthesia Procedure Notes (Addendum)
 Anesthesia Regional Block: Adductor canal block   Pre-Anesthetic Checklist: , timeout performed,  Correct Patient, Correct Site, Correct Laterality,  Correct Procedure, Correct Position, site marked,  Risks and benefits discussed,  Surgical consent,  Pre-op evaluation,  At surgeon's request and post-op pain management  Laterality: Right and Lower  Prep: chloraprep       Needles:  Injection technique: Single-shot  Needle Type: Echogenic Needle     Needle Length: 5cm  Needle Gauge: 21     Additional Needles:   Procedures:,,,, ultrasound used (permanent image in chart),,     Nerve Stimulator or Paresthesia:   Additional Responses:  Block tested.  Patient tolerated procedure well Narrative:  Start time: 12/11/2023 9:27 AM End time: 12/11/2023 9:30 AM Injection made incrementally with aspirations every 5 mL.  Performed by: Personally  Anesthesiologist: Rosalita Combe, MD  Additional Notes: Block tested. Patient tolerated procedure well.

## 2023-12-11 NOTE — Interval H&P Note (Signed)
 History and Physical Interval Note:  12/11/2023 9:23 AM  Susan Bruce  has presented today for surgery, with the diagnosis of Right knee osteoarthritis.  The various methods of treatment have been discussed with the patient and family. After consideration of risks, benefits and other options for treatment, the patient has consented to  Procedure(s): ARTHROPLASTY, KNEE, TOTAL (Right) as a surgical intervention.  The patient's history has been reviewed, patient examined, no change in status, stable for surgery.  I have reviewed the patient's chart and labs.  Questions were answered to the patient's satisfaction.     Lorriane Rote

## 2023-12-11 NOTE — Transfer of Care (Signed)
 Immediate Anesthesia Transfer of Care Note  Patient: Susan Bruce  Procedure(s) Performed: ARTHROPLASTY, KNEE, TOTAL (Right: Knee)  Patient Location: PACU  Anesthesia Type:General  Level of Consciousness: awake, alert , and oriented  Airway & Oxygen Therapy: Patient Spontanous Breathing  Post-op Assessment: Report given to RN  Post vital signs: Reviewed and stable  Last Vitals:  Vitals Value Taken Time  BP 137/84 12/11/23 1323  Temp    Pulse 84 12/11/23 1329  Resp 20 12/11/23 1329  SpO2 100 % 12/11/23 1329  Vitals shown include unfiled device data.  Last Pain:  Vitals:   12/11/23 1015  TempSrc:   PainSc: 0-No pain         Complications: No notable events documented.

## 2023-12-11 NOTE — Anesthesia Procedure Notes (Signed)
 Procedure Name: LMA Insertion Date/Time: 12/11/2023 11:58 AM  Performed by: Darlena Ego, CRNAPre-anesthesia Checklist: Patient identified, Emergency Drugs available, Suction available, Patient being monitored and Timeout performed Patient Re-evaluated:Patient Re-evaluated prior to induction Oxygen Delivery Method: Circle System Utilized Preoxygenation: Pre-oxygenation with 100% oxygen Induction Type: IV induction Ventilation: Mask ventilation without difficulty LMA: LMA inserted LMA Size: 4.0 Number of attempts: 1 Airway Equipment and Method: Bite block Placement Confirmation: positive ETCO2 Tube secured with: Tape Dental Injury: Teeth and Oropharynx as per pre-operative assessment

## 2023-12-11 NOTE — Anesthesia Postprocedure Evaluation (Signed)
 Anesthesia Post Note  Patient: Susan Bruce  Procedure(s) Performed: ARTHROPLASTY, KNEE, TOTAL (Right: Knee)     Patient location during evaluation: PACU Anesthesia Type: Regional Level of consciousness: awake and alert Pain management: pain level controlled Vital Signs Assessment: post-procedure vital signs reviewed and stable Respiratory status: spontaneous breathing, nonlabored ventilation, respiratory function stable and patient connected to nasal cannula oxygen Cardiovascular status: blood pressure returned to baseline and stable Postop Assessment: no apparent nausea or vomiting Anesthetic complications: no  No notable events documented.  Last Vitals:  Vitals:   12/11/23 1553 12/11/23 1612  BP: (!) 149/82 (!) 160/98  Pulse: 91 96  Resp: 20 16  Temp: 36.6 C 37.1 C  SpO2: 99% 97%    Last Pain:  Vitals:   12/11/23 1612  TempSrc: Oral  PainSc: 4                  Rosalita Combe

## 2023-12-11 NOTE — Op Note (Signed)
 NAME: Susan Bruce, MUCKLE MEDICAL RECORD NO: [id number] ACCOUNT NO: [id number] DATE OF BIRTH: [birth year] FACILITY: [facility] LOCATION: [facility] PHYSICIAN: Loreta Rome. Brunilda Capra, MD  Operative Report   DATE OF PROCEDURE: 12/11/2023  PREOPERATIVE DIAGNOSIS:  Right knee end-stage arthritis.  POSTOPERATIVE DIAGNOSIS:  Right knee end-stage arthritis.  PROCEDURE PERFORMED:  Right total knee arthroplasty using DePuy Attune prosthesis.  ATTENDING SURGEON:  Loreta Rome. Brunilda Capra, MD  ASSISTANT:  Barton Like Dixon, [place], who was scrubbed during the entire procedure, and necessary for satisfactory completion of surgery.  ANESTHESIA:  General anesthesia was used plus adductor canal block.  ESTIMATED BLOOD LOSS:  Minimal.  FLUID REPLACEMENT:  1000 mL of crystalloid.  INSTRUMENT COUNTS:  Correct.  COMPLICATIONS: No complications.  ANTIBIOTICS:  Perioperative antibiotics were given.  TOURNIQUET TIME:  87 minutes at 300 mmHg.  INDICATIONS:  The patient is an 83 year old female with worsening right valgus knee deformity and pain due to end-stage arthritis with bone-on-bone.  The patient has failed conservative management and desires operative treatment to eliminate pain and  restore function.  Informed consent obtained.  DESCRIPTION OF PROCEDURE:  After an adequate level of anesthesia was achieved, the patient was positioned in the supine position.  A nonsterile tourniquet was placed on the right proximal thigh.  The right leg was sterilely prepped and draped in the  usual manner.  Timeout called verifying the correct patient and correct site.  We elevated the leg and exsanguinated with an Esmarch bandage, inflated the tourniquet to 300 mmHg.  We then placed the knee in flexion and performed a longitudinal midline  incision with a 10 blade scalpel.  Dissection down through the subcutaneous tissues.  A fresh 10 blade was used for a medial parapatellar arthrotomy.  We divided lateral patellofemoral ligaments, everting  the patella and exposing the distal femur, which  was devoid of cartilage.  We entered the distal femur with a step-cut drill finding very, very soft bone.  We placed an intramedullary guide and resected 11 mm off the distal femur set on 3 degrees of valgus for this valgus knee with a flexion  contracture.  Once we had our distal cut done, we sized her femur to a size 5 anterior down, performing anterior, posterior, and chamfer cuts with a 4-in-1 block.  ACL, PCL, and meniscal tissues were removed with the Bovie.  We then went ahead and  subluxed the tibia anteriorly gaining good exposure of the tibia for our tibial cut.  We placed an external alignment guide and cut our tibia 2 mm off the affected lateral side perpendicular to the long axis of the tibia with minimal posterior slope.   Once we had that tibial cut done, we used a laminar spreader to expose the posterior aspect of the joint where we removed posterior femoral condyle osteophytes.  We did a release of the posterior capsule.  We injected the posterior capsule with a  combination of Marcaine , Exparel  and saline.  We checked our gaps, which were symmetric at 6 mm.  We then went ahead and completed our tibial preparation with a modular drill and keel punch for the 5 tibia, externally rotating the component as much as  possible for patellar tracking purposes.  We then went ahead and cut the femur with the box cut for the 5 right femur.  Once we had that done, we trialed with the 5 right femoral trial and drilled our lug holes.  We then trialed with the size 5 6 mm  poly, reduced  the knee and we were happy with her soft tissue balancing and the ability to get to full extension.  We then resurfaced the patella going from a 25 mm thickness down to 15 mm thickness drilling lug holes for the 38 patellar button.  We  ranged the knee.  We did a little lateral release, which improved tracking, which was almost perfect.  We just needed a little release and  that improved tracking to normal with the no-touch technique.  We then removed all trial components, irrigated  thoroughly.  We dried the bone well.  We vacuum mixed high viscosity cement and cemented the components into place, size 5 tibia, size 5 right femur, placed a 6-mm poly trial in place, and placed the knee in extension for compression of the cement while  the cement set up and we used a patellar clamp for the patella.  Once all cement was hardened on the back table, we injected the anterior capsule with Marcaine , Exparel  and saline.  We then went ahead and removed excess cement with quarter-inch curved  osteotomes.  Final inspection of the knee joint and lavaged.  We selected the real size 5 7-mm poly, placed on the tibial tray, and reduced the knee.  A nice little pop as the medial condyle reduced.  Very nice, stable knee.  Throughout a full arc of  motion with excellent patellar tracking.  We irrigated again and closed the parapatellar arthrotomy with #1 Vicryl suture followed by 0 and 2-0 Vicryl layered subcutaneous closure and 4-0 running Monocryl for skin.  Steri-Strips were applied followed by  a sterile dressing.  The patient tolerated the surgery well.   PUS D: 12/11/2023 1:27:14 pm T: 12/11/2023 8:59:00 pm  JOB: [id number]/ [id number]

## 2023-12-11 NOTE — Discharge Instructions (Signed)
 Ice to the knee constantly.  Keep the incision covered and clean and dry for one week, then ok to get it wet in the shower.  Do exercise as instructed every hour, please to prevent stiffness.    DO NOT prop anything under the knee, it will make your knee stiff.  Prop under the ankle to encourage your knee to go straight.   Use the walker while you are up and around for balance.  Wear your support stockings 24/7 to prevent blood clots and take baby aspirin twice daily for 30 days also to prevent blood clots  Follow up with Dr Ranell Patrick in two weeks in the office, call [phone] for appt  Please call Dr Haakon Titsworth(cell) [phone] with any questions or concerns  INSTRUCTIONS AFTER JOINT REPLACEMENT   Remove items at home which could result in a fall. This includes throw rugs or furniture in walking pathways ICE to the affected joint every three hours while awake for 30 minutes at a time, for at least the first 3-5 days, and then as needed for pain and swelling.  Continue to use ice for pain and swelling. You may notice swelling that will progress down to the foot and ankle.  This is normal after surgery.  Elevate your leg when you are not up walking on it.   Continue to use the breathing machine you got in the hospital (incentive spirometer) which will help keep your temperature down.  It is common for your temperature to cycle up and down following surgery, especially at night when you are not up moving around and exerting yourself.  The breathing machine keeps your lungs expanded and your temperature down.   DIET:  As you were doing prior to hospitalization, we recommend a well-balanced diet.  DRESSING / WOUND CARE / SHOWERING  You may change your dressing 3-5 days after surgery.  Then change the dressing every day with sterile gauze.  Please use good hand washing techniques before changing the dressing.  Do not use any lotions or creams on the incision until instructed by your  surgeon.  ACTIVITY  Increase activity slowly as tolerated, but follow the weight bearing instructions below.   No driving for 6 weeks or until further direction given by your physician.  You cannot drive while taking narcotics.  No lifting or carrying greater than 10 lbs. until further directed by your surgeon. Avoid periods of inactivity such as sitting longer than an hour when not asleep. This helps prevent blood clots.  You may return to work once you are authorized by your doctor.     WEIGHT BEARING   Weight bearing as tolerated with assist device (walker, cane, etc) as directed, use it as long as suggested by your surgeon or therapist, typically at least 4-6 weeks.   EXERCISES  Results after joint replacement surgery are often greatly improved when you follow the exercise, range of motion and muscle strengthening exercises prescribed by your doctor. Safety measures are also important to protect the joint from further injury. Any time any of these exercises cause you to have increased pain or swelling, decrease what you are doing until you are comfortable again and then slowly increase them. If you have problems or questions, call your caregiver or physical therapist for advice.   Rehabilitation is important following a joint replacement. After just a few days of immobilization, the muscles of the leg can become weakened and shrink (atrophy).  These exercises are designed to build up the  tone and strength of the thigh and leg muscles and to improve motion. Often times heat used for twenty to thirty minutes before working out will loosen up your tissues and help with improving the range of motion but do not use heat for the first two weeks following surgery (sometimes heat can increase post-operative swelling).   These exercises can be done on a training (exercise) mat, on the floor, on a table or on a bed. Use whatever works the best and is most comfortable for you.    Use music or  television while you are exercising so that the exercises are a pleasant break in your day. This will make your life better with the exercises acting as a break in your routine that you can look forward to.   Perform all exercises about fifteen times, three times per day or as directed.  You should exercise both the operative leg and the other leg as well.  Exercises include:   Quad Sets - Tighten up the muscle on the front of the thigh (Quad) and hold for 5-10 seconds.   Straight Leg Raises - With your knee straight (if you were given a brace, keep it on), lift the leg to 60 degrees, hold for 3 seconds, and slowly lower the leg.  Perform this exercise against resistance later as your leg gets stronger.  Leg Slides: Lying on your back, slowly slide your foot toward your buttocks, bending your knee up off the floor (only go as far as is comfortable). Then slowly slide your foot back down until your leg is flat on the floor again.  Angel Wings: Lying on your back spread your legs to the side as far apart as you can without causing discomfort.  Hamstring Strength:  Lying on your back, push your heel against the floor with your leg straight by tightening up the muscles of your buttocks.  Repeat, but this time bend your knee to a comfortable angle, and push your heel against the floor.  You may put a pillow under the heel to make it more comfortable if necessary.   A rehabilitation program following joint replacement surgery can speed recovery and prevent re-injury in the future due to weakened muscles. Contact your doctor or a physical therapist for more information on knee rehabilitation.    CONSTIPATION  Constipation is defined medically as fewer than three stools per week and severe constipation as less than one stool per week.  Even if you have a regular bowel pattern at home, your normal regimen is likely to be disrupted due to multiple reasons following surgery.  Combination of anesthesia,  postoperative narcotics, change in appetite and fluid intake all can affect your bowels.   YOU MUST use at least one of the following options; they are listed in order of increasing strength to get the job done.  They are all available over the counter, and you may need to use some, POSSIBLY even all of these options:    Drink plenty of fluids (prune juice may be helpful) and high fiber foods Colace 100 mg by mouth twice a day  Senokot for constipation as directed and as needed Dulcolax (bisacodyl), take with full glass of water  Miralax (polyethylene glycol) once or twice a day as needed.  If you have tried all these things and are unable to have a bowel movement in the first 3-4 days after surgery call either your surgeon or your primary doctor.    If you experience loose  stools or diarrhea, hold the medications until you stool forms back up.  If your symptoms do not get better within 1 week or if they get worse, check with your doctor.  If you experience "the worst abdominal pain ever" or develop nausea or vomiting, please contact the office immediately for further recommendations for treatment.   ITCHING:  If you experience itching with your medications, try taking only a single pain pill, or even half a pain pill at a time.  You can also use Benadryl over the counter for itching or also to help with sleep.   TED HOSE STOCKINGS:  Use stockings on both legs until for at least 2 weeks or as directed by physician office. They may be removed at night for sleeping.  MEDICATIONS:  See your medication summary on the "After Visit Summary" that nursing will review with you.  You may have some home medications which will be placed on hold until you complete the course of blood thinner medication.  It is important for you to complete the blood thinner medication as prescribed.  PRECAUTIONS:  If you experience chest pain or shortness of breath - call 911 immediately for transfer to the hospital emergency  department.   If you develop a fever greater that 101 F, purulent drainage from wound, increased redness or drainage from wound, foul odor from the wound/dressing, or calf pain - CONTACT YOUR SURGEON.                                                   FOLLOW-UP APPOINTMENTS:  If you do not already have a post-op appointment, please call the office for an appointment to be seen by your surgeon.  Guidelines for how soon to be seen are listed in your "After Visit Summary", but are typically between 1-4 weeks after surgery.  OTHER INSTRUCTIONS:   Knee Replacement:  Do not place pillow under knee, focus on keeping the knee straight while resting. CPM instructions: 0-90 degrees, 2 hours in the morning, 2 hours in the afternoon, and 2 hours in the evening. Place foam block, curve side up under heel at all times except when in CPM or when walking.  DO NOT modify, tear, cut, or change the foam block in any way.  POST-OPERATIVE OPIOID TAPER INSTRUCTIONS: It is important to wean off of your opioid medication as soon as possible. If you do not need pain medication after your surgery it is ok to stop day one. Opioids include: Codeine, Hydrocodone(Norco, Vicodin), Oxycodone(Percocet, oxycontin) and hydromorphone amongst others.  Long term and even short term use of opiods can cause: Increased pain response Dependence Constipation Depression Respiratory depression And more.  Withdrawal symptoms can include Flu like symptoms Nausea, vomiting And more Techniques to manage these symptoms Hydrate well Eat regular healthy meals Stay active Use relaxation techniques(deep breathing, meditating, yoga) Do Not substitute Alcohol to help with tapering If you have been on opioids for less than two weeks and do not have pain than it is ok to stop all together.  Plan to wean off of opioids This plan should start within one week post op of your joint replacement. Maintain the same interval or time between taking  each dose and first decrease the dose.  Cut the total daily intake of opioids by one tablet each day Next start to increase the  time between doses. The last dose that should be eliminated is the evening dose.   MAKE SURE YOU:  Understand these instructions.  Get help right away if you are not doing well or get worse.    Thank you for letting us be a part of your medical care team.  It is a privilege we respect greatly.  We hope these instructions will help you stay on track for a fast and full recovery!

## 2023-12-11 NOTE — Progress Notes (Signed)
 Orthopedic Tech Progress Note Patient Details:  AMEL KITCH 03/30/1941 [id number] CPM will be removed at 2030 CPM Right Knee CPM Right Knee: On Right Knee Flexion (Degrees): 50 Right Knee Extension (Degrees): 0  Post Interventions Patient Tolerated: Well  Mearl Spice Jackson Coffield 12/11/2023, 4:36 PM

## 2023-12-11 NOTE — Care Plan (Signed)
 Ortho Bundle Case Management Note  Patient Details  Name: Susan Bruce MRN: [id number] Date of Birth: April 08, 1941                  RT TKA on 12/11/23 DCP: Home with husband DME: No needs, has RW and cane PT: EO 4/28   DME Arranged:  N/A DME Agency:     HH Arranged:    HH Agency:     Additional Comments: Please contact me with any questions of if this plan should need to change.    Kathlene Paradise, Case Manager [facility] [phone] 12/11/2023, 9:47 AM

## 2023-12-11 NOTE — Brief Op Note (Signed)
 12/11/2023  1:22 PM  PATIENT:  Susan Bruce  83 y.o. female  PRE-OPERATIVE DIAGNOSIS:  Right knee osteoarthritis, end stage  POST-OPERATIVE DIAGNOSIS:  Right knee osteoarthritis, end stage  PROCEDURE:  Procedure(s): ARTHROPLASTY, KNEE, TOTAL (Right) DePuy Attune  SURGEON:  Surgeons and Role:    Winston Hawking, MD - Primary  PHYSICIAN ASSISTANT:   ASSISTANTS: Corinthia Dickinson, PA-C   ANESTHESIA:   regional and general  EBL:  minimal  BLOOD ADMINISTERED:none  DRAINS: none   LOCAL MEDICATIONS USED:  MARCAINE      SPECIMEN:  No Specimen  DISPOSITION OF SPECIMEN:  N/A  COUNTS:  YES  TOURNIQUET:   Total Tourniquet Time Documented: Thigh (Right) - 87 minutes Total: Thigh (Right) - 87 minutes   DICTATION: .Other Dictation: Dictation Number [id number]  PLAN OF CARE: Admit for overnight observation  PATIENT DISPOSITION:  PACU - hemodynamically stable.   Delay start of Pharmacological VTE agent (>24hrs) due to surgical blood loss or risk of bleeding: no

## 2023-12-12 DIAGNOSIS — Z79899 Other long term (current) drug therapy: Secondary | ICD-10-CM | POA: Diagnosis not present

## 2023-12-12 DIAGNOSIS — Z85828 Personal history of other malignant neoplasm of skin: Secondary | ICD-10-CM | POA: Diagnosis not present

## 2023-12-12 DIAGNOSIS — M1711 Unilateral primary osteoarthritis, right knee: Secondary | ICD-10-CM | POA: Diagnosis not present

## 2023-12-12 LAB — BASIC METABOLIC PANEL WITH GFR
Anion gap: 8 (ref 5–15)
BUN: 12 mg/dL (ref 8–23)
CO2: 25 mmol/L (ref 22–32)
Calcium: 9.3 mg/dL (ref 8.9–10.3)
Chloride: 101 mmol/L (ref 98–111)
Creatinine, Ser: 0.69 mg/dL (ref 0.44–1.00)
GFR, Estimated: >60 mL/min (ref >60)
Glucose, Bld: 157 mg/dL — ABNORMAL HIGH (ref 70–99)
Potassium: 4.4 mmol/L (ref 3.5–5.1)
Sodium: 134 mmol/L — ABNORMAL LOW (ref 135–145)

## 2023-12-12 LAB — CBC
HCT: 36.3 % (ref 36.0–46.0)
Hemoglobin: 12.1 g/dL (ref 12.0–15.0)
MCH: 30.9 pg (ref 26.0–34.0)
MCHC: 33.3 g/dL (ref 30.0–36.0)
MCV: 92.6 fL (ref 80.0–100.0)
Platelets: 168 10*3/uL (ref 150–400)
RBC: 3.92 MIL/uL (ref 3.87–5.11)
RDW: 12.5 % (ref 11.5–15.5)
WBC: 10 10*3/uL (ref 4.0–10.5)
nRBC: 0 % (ref 0.0–0.2)

## 2023-12-12 NOTE — Plan of Care (Signed)

## 2023-12-12 NOTE — Evaluation (Signed)
 Physical Therapy Evaluation Patient Details Name: Susan Bruce MRN: [id number] DOB: 1941/02/22 Today's Date: 12/12/2023  History of Present Illness  83 yo female s/p R TKA on 12/11/23. PMH: arthritis, HLD, sleep apnea, appy, hernia repair  Clinical Impression  Pt is s/p TKA resulting in the deficits listed below (see PT Problem List).  Pt reports ind at baseline. Amb 8' with RW and min  assist, needing seated rest after this distance. Will see again in pm to determine d/c readiness  Pt will benefit from acute skilled PT to increase their independence and safety with mobility to allow discharge.          If plan is discharge home, recommend the following: A little help with walking and/or transfers;A little help with bathing/dressing/bathroom;Assistance with cooking/housework;Help with stairs or ramp for entrance   Can travel by private vehicle        Equipment Recommendations None recommended by PT  Recommendations for Other Services       Functional Status Assessment Patient has had a recent decline in their functional status and demonstrates the ability to make significant improvements in function in a reasonable and predictable amount of time.     Precautions / Restrictions Precautions Precautions: Fall;Knee Required Braces or Orthoses: Knee Immobilizer - Right Restrictions Weight Bearing Restrictions Per Provider Order: No      Mobility  Bed Mobility Overal bed mobility: Needs Assistance Bed Mobility: Supine to Sit     Supine to sit: Min assist     General bed mobility comments: incr time, cues to self assist. assist with RLE    Transfers Overall transfer level: Needs assistance Equipment used: Rolling walker (2 wheels) Transfers: Sit to/from Stand, Bed to chair/wheelchair/BSC Sit to Stand: Min assist, From elevated surface   Step pivot transfers: Min assist       General transfer comment: assist to rise and steady. cues for hand placement     Ambulation/Gait Ambulation/Gait assistance: Min assist Gait Distance (Feet): 8 Feet Assistive device: Rolling walker (2 wheels) Gait Pattern/deviations: Step-to pattern, Antalgic, Decreased stance time - right       General Gait Details: cues for trunk extension, RW position and sequence  Stairs            Wheelchair Mobility     Tilt Bed    Modified Rankin (Stroke Patients Only)       Balance Overall balance assessment: Needs assistance Sitting-balance support: No upper extremity supported, Feet supported Sitting balance-Leahy Scale: Fair     Standing balance support: Reliant on assistive device for balance Standing balance-Leahy Scale: Fair                               Pertinent Vitals/Pain Pain Assessment Pain Assessment: Faces Faces Pain Scale: Hurts even more Pain Location: right knee Pain Descriptors / Indicators: Grimacing, Sore Pain Intervention(s): Limited activity within patient's tolerance, Monitored during session, Premedicated before session, Repositioned    Home Living Family/patient expects to be discharged to:: Private residence Living Arrangements: Spouse/significant other   Type of Home: House Home Access: Level entry       Home Layout: One level Home Equipment: Agricultural consultant (2 wheels)      Prior Function Prior Level of Function : Independent/Modified Independent                     Extremity/Trunk Assessment   Upper Extremity Assessment Upper Extremity Assessment: Overall  WFL for tasks assessed    Lower Extremity Assessment Lower Extremity Assessment: RLE deficits/detail RLE Deficits / Details: ankle WFL, knee extension and hip flexion 2+/5. limited by post op pain and weakness       Communication   Communication Factors Affecting Communication: Hearing impaired    Cognition Arousal: Alert Behavior During Therapy: WFL for tasks assessed/performed   PT - Cognitive impairments: No apparent  impairments                         Following commands: Intact       Cueing       General Comments General comments (skin integrity, edema, etc.): assisted with donning briefs after toileting    Exercises Total Joint Exercises Ankle Circles/Pumps: AROM, Both, 5 reps   Assessment/Plan    PT Assessment Patient needs continued PT services  PT Problem List Decreased strength;Decreased range of motion;Decreased activity tolerance;Decreased balance;Decreased mobility;Decreased knowledge of use of DME;Pain       PT Treatment Interventions DME instruction;Gait training;Functional mobility training;Therapeutic activities;Therapeutic exercise;Patient/family education    PT Goals (Current goals can be found in the Care Plan section)  Acute Rehab PT Goals PT Goal Formulation: With patient Time For Goal Achievement: 12/19/23 Potential to Achieve Goals: Good    Frequency 7X/week     Co-evaluation               AM-PAC PT "6 Clicks" Mobility  Outcome Measure Help needed turning from your back to your side while in a flat bed without using bedrails?: A Little Help needed moving from lying on your back to sitting on the side of a flat bed without using bedrails?: A Little Help needed moving to and from a bed to a chair (including a wheelchair)?: A Little Help needed standing up from a chair using your arms (e.g., wheelchair or bedside chair)?: A Little Help needed to walk in hospital room?: A Lot Help needed climbing 3-5 steps with a railing? : A Lot 6 Click Score: 16    End of Session Equipment Utilized During Treatment: Gait belt;Right knee immobilizer Activity Tolerance: Patient tolerated treatment well Patient left: with call bell/phone within reach;in chair;with chair alarm set   PT Visit Diagnosis: Other abnormalities of gait and mobility (R26.89)    Time: 1610-9604 PT Time Calculation (min) (ACUTE ONLY): 26 min   Charges:   PT Evaluation $PT Eval Low  Complexity: 1 Low PT Treatments $Gait Training: 8-22 mins PT General Charges $$ ACUTE PT VISIT: 1 Visit         Shauntelle Jamerson, PT  Acute Rehab Dept ([facility]) [phone]  12/12/2023   [facility] 12/12/2023, 12:06 PM

## 2023-12-12 NOTE — Care Management Obs Status (Signed)
 MEDICARE OBSERVATION STATUS NOTIFICATION   Patient Details  Name: MYKAYLAH CUSTODIO MRN: [id number] Date of Birth: 01-14-1941   Medicare Observation Status Notification Given:  Yes    Terrionna Bridwell Liane Redman, LCSW 12/12/2023, 11:15 AM

## 2023-12-12 NOTE — Progress Notes (Signed)
 Physical Therapy Treatment Patient Details Name: Susan Bruce MRN: [id number] DOB: 1941/08/14 Today's Date: 12/12/2023   History of Present Illness 83 yo female s/p R TKA on 12/11/23. PMH: arthritis, HLD, sleep apnea, appy, hernia repair    PT Comments  Pt progressing steadily however continues to require min assist with bed mobility and min to mod assist with STS transfers. Incr amb distance this pm however requiring multiple standing rests during 35'. Pt will benefit from another day to work with PT in preparation for d/c home    If plan is discharge home, recommend the following: A little help with walking and/or transfers;A little help with bathing/dressing/bathroom;Assistance with cooking/housework;Help with stairs or ramp for entrance   Can travel by private vehicle        Equipment Recommendations  None recommended by PT    Recommendations for Other Services       Precautions / Restrictions Precautions Precautions: Fall;Knee Recall of Precautions/Restrictions: Intact Required Braces or Orthoses: Knee Immobilizer - Right Restrictions Weight Bearing Restrictions Per Provider Order: No     Mobility  Bed Mobility Overal bed mobility: Needs Assistance Bed Mobility: Supine to Sit, Sit to Supine     Supine to sit: Min assist Sit to supine: Min assist   General bed mobility comments: incr time, cues to self assist. assist with RLE    Transfers Overall transfer level: Needs assistance Equipment used: Rolling walker (2 wheels) Transfers: Sit to/from Stand Sit to Stand: Min assist, Mod assist, From elevated surface   Step pivot transfers: Min assist       General transfer comment: 2 attempts to stand; assist to rise, transition to RW  and steady. cues for hand placement    Ambulation/Gait Ambulation/Gait assistance: Min assist Gait Distance (Feet): 35 Feet Assistive device: Rolling walker (2 wheels) Gait Pattern/deviations: Step-to pattern, Antalgic, Decreased  stance time - right       General Gait Details: cues for trunk extension, RW position and sequence; beginning early step through   Stairs             Wheelchair Mobility     Tilt Bed    Modified Rankin (Stroke Patients Only)       Balance Overall balance assessment: Needs assistance Sitting-balance support: No upper extremity supported, Feet supported Sitting balance-Leahy Scale: Fair     Standing balance support: Reliant on assistive device for balance Standing balance-Leahy Scale: Fair                              Communication Communication Communication: No apparent difficulties;Impaired Factors Affecting Communication: Hearing impaired  Cognition Arousal: Alert Behavior During Therapy: WFL for tasks assessed/performed   PT - Cognitive impairments: No apparent impairments                         Following commands: Intact      Cueing Cueing Techniques: Verbal cues  Exercises Total Joint Exercises Ankle Circles/Pumps: AROM, Both, 10 reps Quad Sets: AROM, Strengthening, Both, 5 reps Heel Slides: AAROM, Right, 10 reps Hip ABduction/ADduction: AAROM, Right, 10 reps Straight Leg Raises: AAROM, Strengthening, Right, 10 reps    General Comments General comments (skin integrity, edema, etc.): assisted with donning briefs after toileting      Pertinent Vitals/Pain Pain Assessment Pain Assessment: Faces Faces Pain Scale: Hurts little more Pain Location: right knee Pain Descriptors / Indicators: Grimacing, Sore Pain Intervention(s): Limited activity  within patient's tolerance, Monitored during session, Premedicated before session, Repositioned    Home Living Family/patient expects to be discharged to:: Private residence Living Arrangements: Spouse/significant other   Type of Home: House Home Access: Level entry       Home Layout: One level Home Equipment: Agricultural consultant (2 wheels)      Prior Function            PT  Goals (current goals can now be found in the care plan section) Acute Rehab PT Goals PT Goal Formulation: With patient Time For Goal Achievement: 12/19/23 Potential to Achieve Goals: Good Progress towards PT goals: Progressing toward goals    Frequency    7X/week      PT Plan      Co-evaluation              AM-PAC PT "6 Clicks" Mobility   Outcome Measure  Help needed turning from your back to your side while in a flat bed without using bedrails?: A Little Help needed moving from lying on your back to sitting on the side of a flat bed without using bedrails?: A Little Help needed moving to and from a bed to a chair (including a wheelchair)?: A Little Help needed standing up from a chair using your arms (e.g., wheelchair or bedside chair)?: A Little Help needed to walk in hospital room?: A Little Help needed climbing 3-5 steps with a railing? : A Lot 6 Click Score: 17    End of Session Equipment Utilized During Treatment: Gait belt;Right knee immobilizer Activity Tolerance: Patient tolerated treatment well Patient left: in bed;with call bell/phone within reach;with bed alarm set;with family/visitor present   PT Visit Diagnosis: Other abnormalities of gait and mobility (R26.89)     Time: 1324-4010 PT Time Calculation (min) (ACUTE ONLY): 23 min  Charges:    $Gait Training: 8-22 mins $Therapeutic Exercise: 8-22 mins PT General Charges $$ ACUTE PT VISIT: 1 Visit                     Alexandria Ida, PT  Acute Rehab Dept [facility]) [phone]  12/12/2023    [facility] 12/12/2023, 2:52 PM

## 2023-12-12 NOTE — Progress Notes (Signed)
    Subjective: Patient seen in rounds for Dr. Brunilda Capra. Patient reports pain as mild to moderate.  Denies N/V/CP/SOB/Abd pain. She denies tingling and numbness in LE bilaterally.   Objective:   VITALS:   Vitals:   12/11/23 2300 12/12/23 0628 12/12/23 0959 12/12/23 1333  BP: (!) 163/74 132/60 (!) 150/75 139/68  Pulse: 98 96 94 99  Resp: 17 16 17 17   Temp: 98.7 F (37.1 C) 99.9 F (37.7 C) 99.7 F (37.6 C) 100.1 F (37.8 C)  TempSrc: Oral Oral Oral Oral  SpO2: 98% 97% 99% 95%  Weight:      Height:        NAD Neurologically intact ABD soft Neurovascular intact Sensation intact distally Intact pulses distally Dorsiflexion/Plantar flexion intact Incision: dressing C/D/I No cellulitis present Compartment soft   Lab Results  Component Value Date   WBC 10.0 12/12/2023   HGB 12.1 12/12/2023   HCT 36.3 12/12/2023   MCV 92.6 12/12/2023   PLT 168 12/12/2023   BMET    Component Value Date/Time   NA 134 (L) 12/12/2023 0350   K 4.4 12/12/2023 0350   CL 101 12/12/2023 0350   CO2 25 12/12/2023 0350   GLUCOSE 157 (H) 12/12/2023 0350   BUN 12 12/12/2023 0350   CREATININE 0.69 12/12/2023 0350   CALCIUM 9.3 12/12/2023 0350   GFRNONAA >60 12/12/2023 0350     Assessment/Plan: 1 Day Post-Op   Principal Problem:   Status post total knee replacement, right   WBAT with walker DVT ppx: Aspirin , SCDs, TEDS PO pain control PT/OT: Slow progression with PT.  Dispo: - Will keep patient overnight.  - D/c pending PT clearance.    Harman Lightning 12/12/2023, 3:06 PM  [facility]  [facility] [address], [address], [place], [place] [address] Phone: [phone] [email or web address] Facebook  [employer]

## 2023-12-13 DIAGNOSIS — Z79899 Other long term (current) drug therapy: Secondary | ICD-10-CM | POA: Diagnosis not present

## 2023-12-13 DIAGNOSIS — M1711 Unilateral primary osteoarthritis, right knee: Secondary | ICD-10-CM | POA: Diagnosis not present

## 2023-12-13 DIAGNOSIS — Z85828 Personal history of other malignant neoplasm of skin: Secondary | ICD-10-CM | POA: Diagnosis not present

## 2023-12-13 LAB — CBC
HCT: 33.3 % — ABNORMAL LOW (ref 36.0–46.0)
Hemoglobin: 11.2 g/dL — ABNORMAL LOW (ref 12.0–15.0)
MCH: 31.4 pg (ref 26.0–34.0)
MCHC: 33.6 g/dL (ref 30.0–36.0)
MCV: 93.3 fL (ref 80.0–100.0)
Platelets: 146 10*3/uL — ABNORMAL LOW (ref 150–400)
RBC: 3.57 MIL/uL — ABNORMAL LOW (ref 3.87–5.11)
RDW: 12.7 % (ref 11.5–15.5)
WBC: 9.6 10*3/uL (ref 4.0–10.5)
nRBC: 0 % (ref 0.0–0.2)

## 2023-12-13 NOTE — Plan of Care (Signed)
  Problem: Safety: Goal: Ability to remain free from injury will improve Outcome: Progressing   Problem: Activity: Goal: Range of joint motion will improve Outcome: Progressing   Problem: Clinical Measurements: Goal: Postoperative complications will be avoided or minimized Outcome: Progressing   Problem: Pain Management: Goal: Pain level will decrease with appropriate interventions Outcome: Progressing

## 2023-12-13 NOTE — Progress Notes (Signed)
    Subjective: Patient seen in rounds for Dr. Brunilda Capra. Patient reports pain as mild to moderate.  Denies N/V/CP/SOB/Abd pain. She denies tingling and numbness in LE.   Objective:   VITALS:   Vitals:   12/12/23 0628 12/12/23 0959 12/12/23 1333 12/12/23 2049  BP: 132/60 (!) 150/75 139/68 135/74  Pulse: 96 94 99 (!) 103  Resp: 16 17 17 16   Temp: 99.9 F (37.7 C) 99.7 F (37.6 C) 100.1 F (37.8 C) 98.8 F (37.1 C)  TempSrc: Oral Oral Oral Oral  SpO2: 97% 99% 95% 97%  Weight:      Height:        NAD Neurologically intact ABD soft Neurovascular intact Sensation intact distally Intact pulses distally Dorsiflexion/Plantar flexion intact Incision: dressing C/D/I No cellulitis present Compartment soft   Lab Results  Component Value Date   WBC 10.0 12/12/2023   HGB 12.1 12/12/2023   HCT 36.3 12/12/2023   MCV 92.6 12/12/2023   PLT 168 12/12/2023   BMET    Component Value Date/Time   NA 134 (L) 12/12/2023 0350   K 4.4 12/12/2023 0350   CL 101 12/12/2023 0350   CO2 25 12/12/2023 0350   GLUCOSE 157 (H) 12/12/2023 0350   BUN 12 12/12/2023 0350   CREATININE 0.69 12/12/2023 0350   CALCIUM 9.3 12/12/2023 0350   GFRNONAA >60 12/12/2023 0350     Assessment/Plan: 2 Days Post-Op   Principal Problem:   Status post total knee replacement, right   WBAT with walker DVT ppx: Aspirin , SCDs, TEDS PO pain control PT/OT: Slow progression with PT.  Dispo: - D/c pending PT clearance.    Ali Ink 12/13/2023, 2:27 AM  [facility]  [facility] [address], [address], [place], [place] [address] Phone: [phone] [email or web address] Facebook  [employer]

## 2023-12-13 NOTE — Progress Notes (Signed)
 Physical Therapy Treatment Patient Details Name: Susan Bruce MRN: [id number] DOB: 05/26/1941 Today's Date: 12/13/2023   History of Present Illness 83 yo female s/p R TKA on 12/11/23. PMH: arthritis, HLD, sleep apnea, appy, hernia repair    PT Comments  Excellent progress, meeting goals; incr gait distance/activity tol and much improved knee flexion during functional tasks.     If plan is discharge home, recommend the following: A little help with walking and/or transfers;A little help with bathing/dressing/bathroom;Assistance with cooking/housework;Help with stairs or ramp for entrance   Can travel by private vehicle        Equipment Recommendations  None recommended by PT    Recommendations for Other Services       Precautions / Restrictions Precautions Precautions: Fall;Knee Recall of Precautions/Restrictions: Intact Precaution/Restrictions Comments: KI not utilized, quad activation improved this session     Mobility  Bed Mobility Overal bed mobility: Needs Assistance Bed Mobility: Supine to Sit     Supine to sit: Supervision     General bed mobility comments: incr time, able to self assist RLE with leg lifter    Transfers Overall transfer level: Needs assistance Equipment used: Rolling walker (2 wheels) Transfers: Sit to/from Stand Sit to Stand: Contact guard assist           General transfer comment: cues for hand placement; STS from bed and BSC. CGA for safety    Ambulation/Gait Ambulation/Gait assistance: Contact guard assist, Supervision Gait Distance (Feet): 65 Feet (10' more) Assistive device: Rolling walker (2 wheels) Gait Pattern/deviations: Step-to pattern, Step-through pattern Gait velocity: decr     General Gait Details: cues for trunk extension, RW position and sequence; beginning  step through pattern end of distance without incr pain   Stairs             Wheelchair Mobility     Tilt Bed    Modified Rankin (Stroke Patients  Only)       Balance   Sitting-balance support: No upper extremity supported, Feet supported Sitting balance-Leahy Scale: Good     Standing balance support: Reliant on assistive device for balance, During functional activity, No upper extremity supported Standing balance-Leahy Scale: Fair (pt is able to manage LB garment and perform peri-care after toileting with supervision)                              Communication Communication Communication: No apparent difficulties;Impaired Factors Affecting Communication: Hearing impaired  Cognition Arousal: Alert Behavior During Therapy: WFL for tasks assessed/performed   PT - Cognitive impairments: No apparent impairments                         Following commands: Intact      Cueing    Exercises      General Comments        Pertinent Vitals/Pain Pain Assessment Pain Assessment: Faces Faces Pain Scale: Hurts little more Pain Location: right knee Pain Descriptors / Indicators: Grimacing, Sore Pain Intervention(s): Limited activity within patient's tolerance, Monitored during session, Premedicated before session, Repositioned    Home Living                          Prior Function            PT Goals (current goals can now be found in the care plan section) Acute Rehab PT Goals PT Goal Formulation: With  patient Time For Goal Achievement: 12/19/23 Potential to Achieve Goals: Good Progress towards PT goals: Progressing toward goals    Frequency    7X/week      PT Plan      Co-evaluation              AM-PAC PT "6 Clicks" Mobility   Outcome Measure  Help needed turning from your back to your side while in a flat bed without using bedrails?: A Little Help needed moving from lying on your back to sitting on the side of a flat bed without using bedrails?: A Little Help needed moving to and from a bed to a chair (including a wheelchair)?: A Little Help needed standing up  from a chair using your arms (e.g., wheelchair or bedside chair)?: A Little Help needed to walk in hospital room?: A Little Help needed climbing 3-5 steps with a railing? : A Little 6 Click Score: 18    End of Session Equipment Utilized During Treatment: Gait belt Activity Tolerance: Patient tolerated treatment well Patient left: in bed;with call bell/phone within reach;with family/visitor present   PT Visit Diagnosis: Other abnormalities of gait and mobility (R26.89)     Time: 6440-3474 PT Time Calculation (min) (ACUTE ONLY): 26 min  Charges:    $Gait Training: 23-37 mins PT General Charges $$ ACUTE PT VISIT: 1 Visit                     Herman Mell, PT  Acute Rehab Dept ([facility]) [phone]  12/13/2023    [facility] 12/13/2023, 10:45 AM

## 2023-12-14 ENCOUNTER — Encounter (HOSPITAL_COMMUNITY): Payer: Self-pay | Admitting: Orthopedic Surgery

## 2023-12-14 DIAGNOSIS — M25561 Pain in right knee: Secondary | ICD-10-CM | POA: Diagnosis not present

## 2023-12-14 DIAGNOSIS — M25661 Stiffness of right knee, not elsewhere classified: Secondary | ICD-10-CM | POA: Diagnosis not present

## 2023-12-15 NOTE — Discharge Summary (Signed)
 In most cases prophylactic antibiotics for Dental procdeures after total joint surgery are not necessary.  Exceptions are as follows:  1. History of prior total joint infection  2. Severely immunocompromised (Organ Transplant, cancer chemotherapy, Rheumatoid biologic meds such as Humera)  3. Poorly controlled diabetes (A1C &gt; 8.0, blood glucose over 200)  If you have one of these conditions, contact your surgeon for an antibiotic prescription, prior to your dental procedure. Orthopedic Discharge Summary        Physician Discharge Summary  Patient ID: Susan Bruce MRN: [id number] DOB/AGE: Oct 27, 1940 83 y.o.  Admit date: 12/11/2023 Discharge date: 12/13/23  Procedures:  Procedure(s) (LRB): ARTHROPLASTY, KNEE, TOTAL (Right)  Attending Physician:  Dr. Marionette Sick  Admission Diagnoses:   right knee end stage osteoarthritis  Discharge Diagnoses:  right knee end stage osteoarthritis   Past Medical History:  Diagnosis Date   Arthritis    Bronchitis    PMH   Cancer (HCC)    Skin cancer- basal- leg   Hyperlipidemia    Insomnia    LBBB (left bundle branch block)    hx of per pt at preop on 12/01/23.   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   Osteopenia    Recurrent upper respiratory infection (URI)    Vascular insufficiency    wears TED Hose   Wears contact lenses    right eye    PCP: Suan Elm, MD   Discharged Condition: good  Hospital Course:  Patient underwent the above stated procedure on 12/11/2023. Patient tolerated the procedure well and brought to the recovery room in good condition and subsequently to the floor. Patient had an uncomplicated hospital course and was stable for discharge.   Disposition: Discharge disposition: 01-Home or Self Care      with follow up in 2 weeks    Follow-up Information     Winston Hawking, MD. Go on 12/24/2023.   Specialty: Orthopedic Surgery Why: You are scheduled for first post op appt  on Thursday May 8 at 1:30pm. Contact information: [address] [place] [address] [phone]         Winston Hawking, MD. Call in 2 week(s).   Specialty: Orthopedic Surgery Why: call for appt in two weeks Contact information: [address] [place] [address] [phone]                 Dental Antibiotics:  In most cases prophylactic antibiotics for Dental procdeures after total joint surgery are not necessary.  Exceptions are as follows:  1. History of prior total joint infection  2. Severely immunocompromised (Organ Transplant, cancer chemotherapy, Rheumatoid biologic meds such as Humera)  3. Poorly controlled diabetes (A1C &gt; 8.0, blood glucose over 200)  If you have one of these conditions, contact your surgeon for an antibiotic prescription, prior to your dental procedure.  Discharge Instructions     Discharge patient   Complete by: As directed    Discharge disposition: 01-Home or Self Care   Discharge patient date: 12/13/2023       Allergies as of 12/13/2023       Reactions   Ciprofloxacin Other (See Comments)   Tendon rupture   Latex Hives   Redness/rash    Other    Band Aid causes redness; Eye drop preservatives-caused eye granules/irritation        Medication List     STOP taking these medications    aspirin  EC 325 MG tablet Replaced  by: aspirin  81 MG chewable tablet       TAKE these medications    acetaminophen  500 MG tablet Commonly known as: TYLENOL  Take 500-1,000 mg by mouth every 6 (six) hours as needed (pain.).   aspirin  81 MG chewable tablet Commonly known as: Aspirin  Childrens Chew 1 tablet (81 mg total) by mouth 2 (two) times daily. Replaces: aspirin  EC 325 MG tablet   cyanocobalamin  1000 MCG tablet Commonly known as: VITAMIN B12 Take 1,000 mcg by mouth 2 (two) times daily.   FISH OIL PO Take 1,200 mg by mouth daily.   GLUCOSAMINE-MSM PO Take 1 tablet by mouth in  the morning and at bedtime.   Magnesium 250 MG Tabs Take 250 mg by mouth daily.   methocarbamol  500 MG tablet Commonly known as: ROBAXIN  Take 1 tablet (500 mg total) by mouth every 8 (eight) hours as needed for muscle spasms.   multivitamin with minerals Tabs tablet Take 1 tablet by mouth daily. Centrum Silver for Women 50+   neomycin-bacitracin-polymyxin [phone] Oint Apply 1 Application topically as needed (wound care).   ondansetron  4 MG tablet Commonly known as: Zofran  Take 1 tablet (4 mg total) by mouth every 8 (eight) hours as needed for vomiting, refractory nausea / vomiting or nausea.   oxyCODONE -acetaminophen  5-325 MG tablet Commonly known as: Percocet Take 1-2 tablets by mouth every 4 (four) hours as needed for severe pain (pain score 7-10).   PRESERVISION AREDS 2 PO Take 1 tablet by mouth in the morning and at bedtime.   Refresh Tears PF 0.5-0.9 % Soln Generic drug: Carboxymethylcell-Glycerin PF Place 1 drop into both eyes in the morning, at noon, and at bedtime.   Vitamin D3 50 MCG (2000 UT) Tabs Take 2,000 Units by mouth daily.          Signed: Corinthia Dickinson 12/15/2023, 8:08 AM  [facility] is now [employer] [address], [address], [place], [place] [address] Phone: [phone] Facebook  Instagram  [employer]

## 2023-12-16 DIAGNOSIS — M25561 Pain in right knee: Secondary | ICD-10-CM | POA: Diagnosis not present

## 2023-12-16 DIAGNOSIS — M25661 Stiffness of right knee, not elsewhere classified: Secondary | ICD-10-CM | POA: Diagnosis not present

## 2023-12-18 DIAGNOSIS — M25661 Stiffness of right knee, not elsewhere classified: Secondary | ICD-10-CM | POA: Diagnosis not present

## 2023-12-18 DIAGNOSIS — M25561 Pain in right knee: Secondary | ICD-10-CM | POA: Diagnosis not present

## 2023-12-21 DIAGNOSIS — M25661 Stiffness of right knee, not elsewhere classified: Secondary | ICD-10-CM | POA: Diagnosis not present

## 2023-12-21 DIAGNOSIS — M25561 Pain in right knee: Secondary | ICD-10-CM | POA: Diagnosis not present

## 2023-12-23 DIAGNOSIS — M25661 Stiffness of right knee, not elsewhere classified: Secondary | ICD-10-CM | POA: Diagnosis not present

## 2023-12-23 DIAGNOSIS — M25561 Pain in right knee: Secondary | ICD-10-CM | POA: Diagnosis not present

## 2023-12-24 DIAGNOSIS — Z4789 Encounter for other orthopedic aftercare: Secondary | ICD-10-CM | POA: Diagnosis not present

## 2023-12-25 ENCOUNTER — Ambulatory Visit (HOSPITAL_COMMUNITY)
Admission: RE | Admit: 2023-12-25 | Discharge: 2023-12-25 | Disposition: A | Discharge status: Home / Self Care | Payer: PRIVATE HEALTH INSURANCE | Source: Ambulatory Visit | Attending: Vascular Surgery | Admitting: Vascular Surgery

## 2023-12-25 ENCOUNTER — Other Ambulatory Visit (HOSPITAL_COMMUNITY): Payer: Self-pay | Admitting: Orthopedic Surgery

## 2023-12-25 DIAGNOSIS — M7989 Other specified soft tissue disorders: Secondary | ICD-10-CM | POA: Insufficient documentation

## 2023-12-25 DIAGNOSIS — M79604 Pain in right leg: Secondary | ICD-10-CM

## 2023-12-25 NOTE — Addendum Note (Signed)
 Addendum  created 12/25/23 1308 by Rosalita Combe, MD   Clinical Note Signed, Intraprocedure Blocks edited, SmartForm saved

## 2023-12-28 DIAGNOSIS — M25661 Stiffness of right knee, not elsewhere classified: Secondary | ICD-10-CM | POA: Diagnosis not present

## 2023-12-28 DIAGNOSIS — M25561 Pain in right knee: Secondary | ICD-10-CM | POA: Diagnosis not present

## 2023-12-31 DIAGNOSIS — M25661 Stiffness of right knee, not elsewhere classified: Secondary | ICD-10-CM | POA: Diagnosis not present

## 2023-12-31 DIAGNOSIS — M25561 Pain in right knee: Secondary | ICD-10-CM | POA: Diagnosis not present

## 2024-01-05 DIAGNOSIS — M25561 Pain in right knee: Secondary | ICD-10-CM | POA: Diagnosis not present

## 2024-01-05 DIAGNOSIS — M25661 Stiffness of right knee, not elsewhere classified: Secondary | ICD-10-CM | POA: Diagnosis not present

## 2024-01-07 DIAGNOSIS — M25561 Pain in right knee: Secondary | ICD-10-CM | POA: Diagnosis not present

## 2024-01-07 DIAGNOSIS — M25661 Stiffness of right knee, not elsewhere classified: Secondary | ICD-10-CM | POA: Diagnosis not present

## 2024-01-12 DIAGNOSIS — M25661 Stiffness of right knee, not elsewhere classified: Secondary | ICD-10-CM | POA: Diagnosis not present

## 2024-01-12 DIAGNOSIS — M25561 Pain in right knee: Secondary | ICD-10-CM | POA: Diagnosis not present

## 2024-01-15 DIAGNOSIS — M25561 Pain in right knee: Secondary | ICD-10-CM | POA: Diagnosis not present

## 2024-01-15 DIAGNOSIS — M25661 Stiffness of right knee, not elsewhere classified: Secondary | ICD-10-CM | POA: Diagnosis not present

## 2024-01-18 DIAGNOSIS — M25561 Pain in right knee: Secondary | ICD-10-CM | POA: Diagnosis not present

## 2024-01-18 DIAGNOSIS — M25661 Stiffness of right knee, not elsewhere classified: Secondary | ICD-10-CM | POA: Diagnosis not present

## 2024-01-27 DIAGNOSIS — M25561 Pain in right knee: Secondary | ICD-10-CM | POA: Diagnosis not present

## 2024-01-27 DIAGNOSIS — M25661 Stiffness of right knee, not elsewhere classified: Secondary | ICD-10-CM | POA: Diagnosis not present

## 2024-02-01 DIAGNOSIS — M25561 Pain in right knee: Secondary | ICD-10-CM | POA: Diagnosis not present

## 2024-02-01 DIAGNOSIS — M25661 Stiffness of right knee, not elsewhere classified: Secondary | ICD-10-CM | POA: Diagnosis not present

## 2024-02-03 DIAGNOSIS — M25661 Stiffness of right knee, not elsewhere classified: Secondary | ICD-10-CM | POA: Diagnosis not present

## 2024-02-03 DIAGNOSIS — M25561 Pain in right knee: Secondary | ICD-10-CM | POA: Diagnosis not present

## 2024-02-08 DIAGNOSIS — M25661 Stiffness of right knee, not elsewhere classified: Secondary | ICD-10-CM | POA: Diagnosis not present

## 2024-02-08 DIAGNOSIS — M25561 Pain in right knee: Secondary | ICD-10-CM | POA: Diagnosis not present

## 2024-02-08 MED ORDER — ROPIVACAINE HCL 5 MG/ML IJ SOLN
INTRAMUSCULAR | Status: DC | PRN
  Administered 2023-12-11: 30 mL via PERINEURAL

## 2024-02-08 MED ORDER — CLONIDINE HCL (ANALGESIA) 100 MCG/ML EP SOLN
EPIDURAL | Status: DC | PRN
  Administered 2023-12-11: 100 ug

## 2024-02-08 NOTE — Addendum Note (Signed)
 Addendum  created 02/08/24 2016 by Jefm Garnette LABOR, MD   Intraprocedure Meds edited

## 2024-02-11 DIAGNOSIS — M25561 Pain in right knee: Secondary | ICD-10-CM | POA: Diagnosis not present

## 2024-02-11 DIAGNOSIS — M25661 Stiffness of right knee, not elsewhere classified: Secondary | ICD-10-CM | POA: Diagnosis not present

## 2024-02-17 DIAGNOSIS — M25561 Pain in right knee: Secondary | ICD-10-CM | POA: Diagnosis not present

## 2024-02-17 DIAGNOSIS — M25661 Stiffness of right knee, not elsewhere classified: Secondary | ICD-10-CM | POA: Diagnosis not present

## 2024-02-22 DIAGNOSIS — M25661 Stiffness of right knee, not elsewhere classified: Secondary | ICD-10-CM | POA: Diagnosis not present

## 2024-02-22 DIAGNOSIS — M25561 Pain in right knee: Secondary | ICD-10-CM | POA: Diagnosis not present

## 2024-02-25 DIAGNOSIS — M25561 Pain in right knee: Secondary | ICD-10-CM | POA: Diagnosis not present

## 2024-02-25 DIAGNOSIS — M25661 Stiffness of right knee, not elsewhere classified: Secondary | ICD-10-CM | POA: Diagnosis not present

## 2024-03-08 DIAGNOSIS — M25532 Pain in left wrist: Secondary | ICD-10-CM | POA: Diagnosis not present

## 2024-03-08 DIAGNOSIS — M170 Bilateral primary osteoarthritis of knee: Secondary | ICD-10-CM | POA: Diagnosis not present

## 2024-03-11 DIAGNOSIS — M25532 Pain in left wrist: Secondary | ICD-10-CM | POA: Diagnosis not present

## 2024-03-11 DIAGNOSIS — M170 Bilateral primary osteoarthritis of knee: Secondary | ICD-10-CM | POA: Diagnosis not present

## 2024-03-15 DIAGNOSIS — M170 Bilateral primary osteoarthritis of knee: Secondary | ICD-10-CM | POA: Diagnosis not present

## 2024-03-15 DIAGNOSIS — M25532 Pain in left wrist: Secondary | ICD-10-CM | POA: Diagnosis not present

## 2024-03-22 DIAGNOSIS — M170 Bilateral primary osteoarthritis of knee: Secondary | ICD-10-CM | POA: Diagnosis not present

## 2024-03-22 DIAGNOSIS — M25532 Pain in left wrist: Secondary | ICD-10-CM | POA: Diagnosis not present

## 2024-03-25 DIAGNOSIS — M25532 Pain in left wrist: Secondary | ICD-10-CM | POA: Diagnosis not present

## 2024-03-25 DIAGNOSIS — M170 Bilateral primary osteoarthritis of knee: Secondary | ICD-10-CM | POA: Diagnosis not present

## 2024-03-28 DIAGNOSIS — R030 Elevated blood-pressure reading, without diagnosis of hypertension: Secondary | ICD-10-CM | POA: Diagnosis not present

## 2024-03-28 DIAGNOSIS — E785 Hyperlipidemia, unspecified: Secondary | ICD-10-CM | POA: Diagnosis not present

## 2024-03-29 DIAGNOSIS — M25532 Pain in left wrist: Secondary | ICD-10-CM | POA: Diagnosis not present

## 2024-03-29 DIAGNOSIS — M170 Bilateral primary osteoarthritis of knee: Secondary | ICD-10-CM | POA: Diagnosis not present

## 2024-04-01 DIAGNOSIS — M170 Bilateral primary osteoarthritis of knee: Secondary | ICD-10-CM | POA: Diagnosis not present

## 2024-04-01 DIAGNOSIS — M25532 Pain in left wrist: Secondary | ICD-10-CM | POA: Diagnosis not present

## 2024-04-05 DIAGNOSIS — Z961 Presence of intraocular lens: Secondary | ICD-10-CM | POA: Diagnosis not present

## 2024-04-05 DIAGNOSIS — H04123 Dry eye syndrome of bilateral lacrimal glands: Secondary | ICD-10-CM | POA: Diagnosis not present

## 2024-04-05 DIAGNOSIS — M170 Bilateral primary osteoarthritis of knee: Secondary | ICD-10-CM | POA: Diagnosis not present

## 2024-04-05 DIAGNOSIS — H18523 Epithelial (juvenile) corneal dystrophy, bilateral: Secondary | ICD-10-CM | POA: Diagnosis not present

## 2024-04-05 DIAGNOSIS — M25532 Pain in left wrist: Secondary | ICD-10-CM | POA: Diagnosis not present

## 2024-04-05 DIAGNOSIS — H30033 Focal chorioretinal inflammation, peripheral, bilateral: Secondary | ICD-10-CM | POA: Diagnosis not present

## 2024-04-05 DIAGNOSIS — H3581 Retinal edema: Secondary | ICD-10-CM | POA: Diagnosis not present

## 2024-04-08 DIAGNOSIS — M25532 Pain in left wrist: Secondary | ICD-10-CM | POA: Diagnosis not present

## 2024-04-08 DIAGNOSIS — M170 Bilateral primary osteoarthritis of knee: Secondary | ICD-10-CM | POA: Diagnosis not present

## 2024-04-11 DIAGNOSIS — Z0000 Encounter for general adult medical examination without abnormal findings: Secondary | ICD-10-CM | POA: Diagnosis not present

## 2024-04-11 DIAGNOSIS — E785 Hyperlipidemia, unspecified: Secondary | ICD-10-CM | POA: Diagnosis not present

## 2024-04-11 DIAGNOSIS — G629 Polyneuropathy, unspecified: Secondary | ICD-10-CM | POA: Diagnosis not present

## 2024-04-11 DIAGNOSIS — Z1339 Encounter for screening examination for other mental health and behavioral disorders: Secondary | ICD-10-CM | POA: Diagnosis not present

## 2024-04-11 DIAGNOSIS — R82998 Other abnormal findings in urine: Secondary | ICD-10-CM | POA: Diagnosis not present

## 2024-04-11 DIAGNOSIS — R600 Localized edema: Secondary | ICD-10-CM | POA: Diagnosis not present

## 2024-04-11 DIAGNOSIS — Z1331 Encounter for screening for depression: Secondary | ICD-10-CM | POA: Diagnosis not present

## 2024-04-12 DIAGNOSIS — M170 Bilateral primary osteoarthritis of knee: Secondary | ICD-10-CM | POA: Diagnosis not present

## 2024-04-12 DIAGNOSIS — M25532 Pain in left wrist: Secondary | ICD-10-CM | POA: Diagnosis not present

## 2024-04-15 DIAGNOSIS — M170 Bilateral primary osteoarthritis of knee: Secondary | ICD-10-CM | POA: Diagnosis not present

## 2024-04-15 DIAGNOSIS — M25532 Pain in left wrist: Secondary | ICD-10-CM | POA: Diagnosis not present

## 2024-04-21 DIAGNOSIS — M25532 Pain in left wrist: Secondary | ICD-10-CM | POA: Diagnosis not present

## 2024-04-21 DIAGNOSIS — M170 Bilateral primary osteoarthritis of knee: Secondary | ICD-10-CM | POA: Diagnosis not present

## 2024-04-26 DIAGNOSIS — M170 Bilateral primary osteoarthritis of knee: Secondary | ICD-10-CM | POA: Diagnosis not present

## 2024-04-26 DIAGNOSIS — M25532 Pain in left wrist: Secondary | ICD-10-CM | POA: Diagnosis not present

## 2024-04-29 DIAGNOSIS — M170 Bilateral primary osteoarthritis of knee: Secondary | ICD-10-CM | POA: Diagnosis not present

## 2024-04-29 DIAGNOSIS — M25532 Pain in left wrist: Secondary | ICD-10-CM | POA: Diagnosis not present

## 2024-04-29 DIAGNOSIS — Z1231 Encounter for screening mammogram for malignant neoplasm of breast: Secondary | ICD-10-CM | POA: Diagnosis not present

## 2024-05-03 DIAGNOSIS — M170 Bilateral primary osteoarthritis of knee: Secondary | ICD-10-CM | POA: Diagnosis not present

## 2024-05-03 DIAGNOSIS — M25532 Pain in left wrist: Secondary | ICD-10-CM | POA: Diagnosis not present

## 2024-05-05 DIAGNOSIS — Z23 Encounter for immunization: Secondary | ICD-10-CM | POA: Diagnosis not present

## 2024-05-06 DIAGNOSIS — M25532 Pain in left wrist: Secondary | ICD-10-CM | POA: Diagnosis not present

## 2024-05-06 DIAGNOSIS — M170 Bilateral primary osteoarthritis of knee: Secondary | ICD-10-CM | POA: Diagnosis not present

## 2024-05-10 DIAGNOSIS — M25532 Pain in left wrist: Secondary | ICD-10-CM | POA: Diagnosis not present

## 2024-05-10 DIAGNOSIS — M170 Bilateral primary osteoarthritis of knee: Secondary | ICD-10-CM | POA: Diagnosis not present

## 2024-05-12 DIAGNOSIS — D2262 Melanocytic nevi of left upper limb, including shoulder: Secondary | ICD-10-CM | POA: Diagnosis not present

## 2024-05-12 DIAGNOSIS — D2271 Melanocytic nevi of right lower limb, including hip: Secondary | ICD-10-CM | POA: Diagnosis not present

## 2024-05-12 DIAGNOSIS — Z85828 Personal history of other malignant neoplasm of skin: Secondary | ICD-10-CM | POA: Diagnosis not present

## 2024-05-12 DIAGNOSIS — D2272 Melanocytic nevi of left lower limb, including hip: Secondary | ICD-10-CM | POA: Diagnosis not present

## 2024-05-12 DIAGNOSIS — D2261 Melanocytic nevi of right upper limb, including shoulder: Secondary | ICD-10-CM | POA: Diagnosis not present

## 2024-05-12 DIAGNOSIS — L905 Scar conditions and fibrosis of skin: Secondary | ICD-10-CM | POA: Diagnosis not present

## 2024-05-12 DIAGNOSIS — L821 Other seborrheic keratosis: Secondary | ICD-10-CM | POA: Diagnosis not present

## 2024-05-12 DIAGNOSIS — L720 Epidermal cyst: Secondary | ICD-10-CM | POA: Diagnosis not present

## 2024-05-12 DIAGNOSIS — D225 Melanocytic nevi of trunk: Secondary | ICD-10-CM | POA: Diagnosis not present

## 2024-05-13 DIAGNOSIS — M170 Bilateral primary osteoarthritis of knee: Secondary | ICD-10-CM | POA: Diagnosis not present

## 2024-05-13 DIAGNOSIS — M25532 Pain in left wrist: Secondary | ICD-10-CM | POA: Diagnosis not present

## 2024-05-17 DIAGNOSIS — M170 Bilateral primary osteoarthritis of knee: Secondary | ICD-10-CM | POA: Diagnosis not present

## 2024-05-17 DIAGNOSIS — M25532 Pain in left wrist: Secondary | ICD-10-CM | POA: Diagnosis not present

## 2024-05-20 DIAGNOSIS — M170 Bilateral primary osteoarthritis of knee: Secondary | ICD-10-CM | POA: Diagnosis not present

## 2024-05-20 DIAGNOSIS — M25532 Pain in left wrist: Secondary | ICD-10-CM | POA: Diagnosis not present

## 2024-05-21 DIAGNOSIS — Z23 Encounter for immunization: Secondary | ICD-10-CM | POA: Diagnosis not present

## 2024-05-24 DIAGNOSIS — M25532 Pain in left wrist: Secondary | ICD-10-CM | POA: Diagnosis not present

## 2024-05-24 DIAGNOSIS — M170 Bilateral primary osteoarthritis of knee: Secondary | ICD-10-CM | POA: Diagnosis not present

## 2024-05-26 DIAGNOSIS — M170 Bilateral primary osteoarthritis of knee: Secondary | ICD-10-CM | POA: Diagnosis not present

## 2024-05-26 DIAGNOSIS — M25532 Pain in left wrist: Secondary | ICD-10-CM | POA: Diagnosis not present

## 2024-05-30 DIAGNOSIS — M25532 Pain in left wrist: Secondary | ICD-10-CM | POA: Diagnosis not present

## 2024-05-30 DIAGNOSIS — M170 Bilateral primary osteoarthritis of knee: Secondary | ICD-10-CM | POA: Diagnosis not present

## 2024-06-03 DIAGNOSIS — M25532 Pain in left wrist: Secondary | ICD-10-CM | POA: Diagnosis not present

## 2024-06-03 DIAGNOSIS — M170 Bilateral primary osteoarthritis of knee: Secondary | ICD-10-CM | POA: Diagnosis not present

## 2024-06-07 DIAGNOSIS — M25532 Pain in left wrist: Secondary | ICD-10-CM | POA: Diagnosis not present

## 2024-06-07 DIAGNOSIS — M170 Bilateral primary osteoarthritis of knee: Secondary | ICD-10-CM | POA: Diagnosis not present

## 2024-06-10 DIAGNOSIS — M170 Bilateral primary osteoarthritis of knee: Secondary | ICD-10-CM | POA: Diagnosis not present

## 2024-06-10 DIAGNOSIS — M25532 Pain in left wrist: Secondary | ICD-10-CM | POA: Diagnosis not present

## 2024-06-14 DIAGNOSIS — M170 Bilateral primary osteoarthritis of knee: Secondary | ICD-10-CM | POA: Diagnosis not present

## 2024-06-14 DIAGNOSIS — M25532 Pain in left wrist: Secondary | ICD-10-CM | POA: Diagnosis not present

## 2024-06-17 DIAGNOSIS — M170 Bilateral primary osteoarthritis of knee: Secondary | ICD-10-CM | POA: Diagnosis not present

## 2024-06-17 DIAGNOSIS — M25532 Pain in left wrist: Secondary | ICD-10-CM | POA: Diagnosis not present

## 2024-06-21 DIAGNOSIS — M170 Bilateral primary osteoarthritis of knee: Secondary | ICD-10-CM | POA: Diagnosis not present

## 2024-06-21 DIAGNOSIS — M25532 Pain in left wrist: Secondary | ICD-10-CM | POA: Diagnosis not present

## 2024-06-24 DIAGNOSIS — M25532 Pain in left wrist: Secondary | ICD-10-CM | POA: Diagnosis not present

## 2024-06-24 DIAGNOSIS — M170 Bilateral primary osteoarthritis of knee: Secondary | ICD-10-CM | POA: Diagnosis not present

## 2024-06-28 DIAGNOSIS — M25532 Pain in left wrist: Secondary | ICD-10-CM | POA: Diagnosis not present

## 2024-06-28 DIAGNOSIS — M170 Bilateral primary osteoarthritis of knee: Secondary | ICD-10-CM | POA: Diagnosis not present

## 2024-07-01 DIAGNOSIS — M25532 Pain in left wrist: Secondary | ICD-10-CM | POA: Diagnosis not present

## 2024-07-01 DIAGNOSIS — M170 Bilateral primary osteoarthritis of knee: Secondary | ICD-10-CM | POA: Diagnosis not present

## 2024-07-05 DIAGNOSIS — M25532 Pain in left wrist: Secondary | ICD-10-CM | POA: Diagnosis not present

## 2024-07-05 DIAGNOSIS — M170 Bilateral primary osteoarthritis of knee: Secondary | ICD-10-CM | POA: Diagnosis not present

## 2024-07-08 DIAGNOSIS — M25532 Pain in left wrist: Secondary | ICD-10-CM | POA: Diagnosis not present

## 2024-07-08 DIAGNOSIS — M170 Bilateral primary osteoarthritis of knee: Secondary | ICD-10-CM | POA: Diagnosis not present

## 2024-07-11 DIAGNOSIS — M170 Bilateral primary osteoarthritis of knee: Secondary | ICD-10-CM | POA: Diagnosis not present

## 2024-07-11 DIAGNOSIS — M25532 Pain in left wrist: Secondary | ICD-10-CM | POA: Diagnosis not present

## 2024-07-19 DIAGNOSIS — M25532 Pain in left wrist: Secondary | ICD-10-CM | POA: Diagnosis not present

## 2024-07-19 DIAGNOSIS — M170 Bilateral primary osteoarthritis of knee: Secondary | ICD-10-CM | POA: Diagnosis not present

## 2024-07-26 DIAGNOSIS — M170 Bilateral primary osteoarthritis of knee: Secondary | ICD-10-CM | POA: Diagnosis not present

## 2024-07-26 DIAGNOSIS — M25532 Pain in left wrist: Secondary | ICD-10-CM | POA: Diagnosis not present

## 2024-07-28 DIAGNOSIS — M170 Bilateral primary osteoarthritis of knee: Secondary | ICD-10-CM | POA: Diagnosis not present

## 2024-07-28 DIAGNOSIS — M25532 Pain in left wrist: Secondary | ICD-10-CM | POA: Diagnosis not present

## 2024-08-02 DIAGNOSIS — M170 Bilateral primary osteoarthritis of knee: Secondary | ICD-10-CM | POA: Diagnosis not present

## 2024-08-02 DIAGNOSIS — M25532 Pain in left wrist: Secondary | ICD-10-CM | POA: Diagnosis not present
